# Patient Record
Sex: Male | Born: 1960 | Race: White | Hispanic: No | Marital: Single | Smoking: Never smoker
Health system: Southern US, Community
[De-identification: ages and names within clinical notes are randomized; demographics above are authoritative.]

## PROBLEM LIST (undated history)

## (undated) DIAGNOSIS — I10 Essential (primary) hypertension: Secondary | ICD-10-CM

## (undated) DIAGNOSIS — Z1211 Encounter for screening for malignant neoplasm of colon: Secondary | ICD-10-CM

## (undated) DIAGNOSIS — Z789 Other specified health status: Secondary | ICD-10-CM

## (undated) DIAGNOSIS — N529 Male erectile dysfunction, unspecified: Secondary | ICD-10-CM

## (undated) DIAGNOSIS — E785 Hyperlipidemia, unspecified: Secondary | ICD-10-CM

## (undated) HISTORY — DX: Hyperlipidemia, unspecified: E78.5

## (undated) HISTORY — PX: GANGLION CYST EXCISION: SHX1691

## (undated) HISTORY — DX: Essential (primary) hypertension: I10

## (undated) HISTORY — DX: Male erectile dysfunction, unspecified: N52.9

## (undated) HISTORY — DX: Encounter for screening for malignant neoplasm of colon: Z12.11

## (undated) HISTORY — PX: CHOLECYSTECTOMY: SHX55

---

## 2009-05-22 ENCOUNTER — Emergency Department: Payer: Self-pay | Admitting: Emergency Medicine

## 2013-03-05 ENCOUNTER — Ambulatory Visit: Payer: Self-pay | Admitting: Emergency Medicine

## 2013-10-18 ENCOUNTER — Ambulatory Visit: Payer: Self-pay | Admitting: Physician Assistant

## 2013-10-19 ENCOUNTER — Ambulatory Visit: Payer: Self-pay | Admitting: Physician Assistant

## 2013-11-18 ENCOUNTER — Ambulatory Visit: Payer: Self-pay | Admitting: Surgery

## 2013-12-16 ENCOUNTER — Ambulatory Visit: Payer: Self-pay | Admitting: Surgery

## 2013-12-30 ENCOUNTER — Ambulatory Visit: Payer: Self-pay | Admitting: Surgery

## 2014-12-24 LAB — POCT INR: INR: 1.03 (ref 0.80–1.20)

## 2014-12-24 LAB — PROTIME-INR: Protime: 11.7 (ref 10.0–13.8)

## 2015-04-21 ENCOUNTER — Ambulatory Visit
Admission: EM | Admit: 2015-04-21 | Discharge: 2015-04-21 | Disposition: A | Discharge status: Home / Self Care | Payer: BLUE CROSS/BLUE SHIELD | Attending: Emergency Medicine | Admitting: Emergency Medicine

## 2015-04-21 DIAGNOSIS — J0110 Acute frontal sinusitis, unspecified: Secondary | ICD-10-CM

## 2015-04-21 DIAGNOSIS — J069 Acute upper respiratory infection, unspecified: Secondary | ICD-10-CM

## 2015-04-21 MED ORDER — PSEUDOEPHEDRINE-GUAIFENESIN ER 120-1200 MG PO TB12: 1.0000 | ORAL_TABLET | Freq: Two times a day (BID) | ORAL | Status: DC | PRN

## 2015-04-21 MED ORDER — ALBUTEROL SULFATE HFA 108 (90 BASE) MCG/ACT IN AERS: 2.0000 | INHALATION_SPRAY | Freq: Four times a day (QID) | RESPIRATORY_TRACT | Status: DC | PRN

## 2015-04-21 MED ORDER — MOMETASONE FUROATE 50 MCG/ACT NA SUSP: 2.0000 | Freq: Every day | NASAL | Status: DC

## 2015-04-21 MED ORDER — HYDROCOD POLST-CPM POLST ER 10-8 MG/5ML PO SUER: 5.0000 mL | Freq: Two times a day (BID) | ORAL | Status: DC

## 2015-04-21 NOTE — ED Notes (Signed)
X 4 days. C/O productive cough with yellow to dark sputum, nasal congestion, cold chills, malaise, sore throat. Denies body aches, fever.

## 2015-04-21 NOTE — Discharge Instructions (Signed)
Take the medication as written. Take 1 gram of tylenol with the 800 mg motrin up to 3 times a day as needed for pain and fever. This is an effective combination. Drink extra fluids. Start taking the mucinex to keep the mucus secretions thin. Use a neti pot or the NeilMed sinus rinse as often as you want to to reduce nasal congestion. Follow the directions on the box. Return if you get worse, have a persistent fever >100.4, or for any concerns.   Go to www.goodrx.com to look up your medications. This will give you a list of where you can find your prescriptions at the most affordable prices.

## 2015-04-21 NOTE — ED Provider Notes (Signed)
  HPI  SUBJECTIVE:  Roberto Holder is a 54 y.o. male who presents with 4 days of nasal congestion, postnasal drip, malaise, body aches, feeling clammy, chest tightness, wheezing at night, coughing greenish yellowish darkish sputum at night and in the morning. Symptoms are better with over-the-counter cold medications no aggravating factors. He tried DayQuil and Benadryl. No nausea, vomiting, fevers, chest pain, short of breath, sore throat, ear pain, allergy type symptoms. No antipyretic in the past 4-6 hours. He has sick contacts with similar symptoms at work. He has not yet gotten his flu shot. Past medical history negative for asthma, emphysema, COPD, diabetes, hypertension, immunocompromise.   No past medical history on file.  No past surgical history on file.  No family history on file.  Social History  Substance Use Topics  . Smoking status: Never Smoker   . Smokeless tobacco: Never Used  . Alcohol Use: Yes    No current facility-administered medications for this encounter.  Current outpatient prescriptions:  .  albuterol (PROVENTIL HFA;VENTOLIN HFA) 108 (90 BASE) MCG/ACT inhaler, Inhale 2 puffs into the lungs every 6 (six) hours as needed for wheezing or shortness of breath., Disp: 1 Inhaler, Rfl: 0 .  chlorpheniramine-HYDROcodone (TUSSIONEX PENNKINETIC ER) 10-8 MG/5ML SUER, Take 5 mLs by mouth 2 (two) times daily., Disp: 120 mL, Rfl: 0 .  mometasone (NASONEX) 50 MCG/ACT nasal spray, Place 2 sprays into the nose daily., Disp: 17 g, Rfl: 0 .  Pseudoephedrine-Guaifenesin (MUCINEX D) [phone] MG TB12, Take 1 tablet by mouth 2 (two) times daily as needed (congestion)., Disp: 20 each, Rfl: 0  No Known Allergies   ROS  As noted in HPI.   Physical Exam  BP 122/88 mmHg  Pulse 90  Temp(Src) 98 F (36.7 C) (Oral)  Resp 16  Ht 5' 8.5" (1.74 m)  Wt 200 lb (90.719 kg)  BMI 29.96 kg/m2  SpO2 99%  Constitutional: Well developed, well nourished, no acute distress Eyes: PERRL,  EOMI, conjunctiva normal bilaterally HENT: Normocephalic, atraumatic,mucus membranes moist. Positive nasal congestion, positive frontal sinus tenderness. No purulent drainage. Normal oropharynx. TMs normal bilaterally Respiratory: Clear to auscultation bilaterally, no rales, no wheezing, no rhonchi Cardiovascular: Normal rate and rhythm, no murmurs, no gallops, no rubs GI: nondistended Lymph: No cervical lymphadenopathy. skin: No rash, skin intact Musculoskeletal: No edema, no tenderness, no deformities Neurologic: Alert & oriented x 3, CN II-XII grossly intact, no motor deficits, sensation grossly intact Psychiatric: Speech and behavior appropriate   ED Course   Medications - No data to display  No orders of the defined types were placed in this encounter.   No results found for this or any previous visit (from the past 24 hour(s)). No results found.  ED Clinical Impression  URI (upper respiratory infection)  Acute frontal sinusitis, recurrence not specified   ED Assessment/Plan No indications for antibiotics at this time. No evidence of bacterial sinusitis, otitis, pneumonia.  Home with Mucinex D, saline nasal irrigation, nasal steroids, albuterol, Cheratussin as needed for cough. Follow up with primary care physician or Return here for double sickening or not getting better in 10 days. Patient agrees with plan.  *This clinic note was created using Dragon dictation software. Therefore, there may be occasional mistakes despite careful proofreading.  ?  Melynda Ripple, MD 04/21/15 [facility]

## 2015-07-14 ENCOUNTER — Emergency Department (HOSPITAL_COMMUNITY): Payer: BLUE CROSS/BLUE SHIELD

## 2015-07-14 ENCOUNTER — Emergency Department (HOSPITAL_COMMUNITY)
Admission: EM | Admit: 2015-07-14 | Discharge: 2015-07-14 | Disposition: A | Discharge status: Home / Self Care | Payer: BLUE CROSS/BLUE SHIELD | Attending: Emergency Medicine | Admitting: Emergency Medicine

## 2015-07-14 ENCOUNTER — Encounter (HOSPITAL_COMMUNITY): Payer: Self-pay

## 2015-07-14 DIAGNOSIS — G44209 Tension-type headache, unspecified, not intractable: Secondary | ICD-10-CM | POA: Diagnosis not present

## 2015-07-14 DIAGNOSIS — Z79899 Other long term (current) drug therapy: Secondary | ICD-10-CM | POA: Diagnosis not present

## 2015-07-14 DIAGNOSIS — R002 Palpitations: Secondary | ICD-10-CM | POA: Insufficient documentation

## 2015-07-14 LAB — CBC
HEMATOCRIT: 45.4 % (ref 39.0–52.0)
Hemoglobin: 15.3 g/dL (ref 13.0–17.0)
MCH: 28.9 pg (ref 26.0–34.0)
MCHC: 33.7 g/dL (ref 30.0–36.0)
MCV: 85.8 fL (ref 78.0–100.0)
PLATELETS: 197 10*3/uL (ref 150–400)
RBC: 5.29 MIL/uL (ref 4.22–5.81)
RDW: 12.5 % (ref 11.5–15.5)
WBC: 6.5 10*3/uL (ref 4.0–10.5)

## 2015-07-14 LAB — BASIC METABOLIC PANEL
Anion gap: 10 (ref 5–15)
BUN: 12 mg/dL (ref 6–20)
CO2: 25 mmol/L (ref 22–32)
CREATININE: 1.1 mg/dL (ref 0.61–1.24)
Calcium: 9.5 mg/dL (ref 8.9–10.3)
Chloride: 106 mmol/L (ref 101–111)
GFR calc Af Amer: >60 mL/min (ref >60)
GLUCOSE: 93 mg/dL (ref 65–99)
POTASSIUM: 4.3 mmol/L (ref 3.5–5.1)
SODIUM: 141 mmol/L (ref 135–145)

## 2015-07-14 LAB — I-STAT TROPONIN, ED: Troponin i, poc: 0 ng/mL (ref 0.00–0.08)

## 2015-07-14 MED ORDER — METHOCARBAMOL 500 MG PO TABS: 1000.0000 mg | ORAL_TABLET | Freq: Three times a day (TID) | ORAL | Status: DC | PRN

## 2015-07-14 MED ORDER — KETOROLAC TROMETHAMINE 60 MG/2ML IM SOLN
60.0000 mg | Freq: Once | INTRAMUSCULAR | Status: AC
  Administered 2015-07-14: 60 mg via INTRAMUSCULAR
  Filled 2015-07-14: qty 2

## 2015-07-14 MED ORDER — METHOCARBAMOL 500 MG PO TABS
1000.0000 mg | ORAL_TABLET | Freq: Once | ORAL | Status: AC
  Administered 2015-07-14: 1000 mg via ORAL
  Filled 2015-07-14: qty 2

## 2015-07-14 MED ORDER — IBUPROFEN 600 MG PO TABS: 600.0000 mg | ORAL_TABLET | Freq: Four times a day (QID) | ORAL | Status: DC | PRN

## 2015-07-14 NOTE — Discharge Instructions (Signed)
Palpitations A palpitation is the feeling that your heartbeat is irregular or is faster than normal. It may feel like your heart is fluttering or skipping a beat. Palpitations are usually not a serious problem. However, in some cases, you may need further medical evaluation. CAUSES  Palpitations can be caused by:  Smoking.  Caffeine or other stimulants, such as diet pills or energy drinks.  Alcohol.  Stress and anxiety.  Strenuous physical activity.  Fatigue.  Certain medicines.  Heart disease, especially if you have a history of irregular heart rhythms (arrhythmias), such as atrial fibrillation, atrial flutter, or supraventricular tachycardia.  An improperly working pacemaker or defibrillator. DIAGNOSIS  To find the cause of your palpitations, your health care provider will take your medical history and perform a physical exam. Your health care provider may also have you take a test called an ambulatory electrocardiogram (ECG). An ECG records your heartbeat patterns over a 24-hour period. You may also have other tests, such as:  Transthoracic echocardiogram (TTE). During echocardiography, sound waves are used to evaluate how blood flows through your heart.  Transesophageal echocardiogram (TEE).  Cardiac monitoring. This allows your health care provider to monitor your heart rate and rhythm in real time.  Holter monitor. This is a portable device that records your heartbeat and can help diagnose heart arrhythmias. It allows your health care provider to track your heart activity for several days, if needed.  Stress tests by exercise or by giving medicine that makes the heart beat faster. TREATMENT  Treatment of palpitations depends on the cause of your symptoms and can vary greatly. Most cases of palpitations do not require any treatment other than time, relaxation, and monitoring your symptoms. Other causes, such as atrial fibrillation, atrial flutter, or supraventricular  tachycardia, usually require further treatment. HOME CARE INSTRUCTIONS   Avoid:  Caffeinated coffee, tea, soft drinks, diet pills, and energy drinks.  Chocolate.  Alcohol.  Stop smoking if you smoke.  Reduce your stress and anxiety. Things that can help you relax include:  A method of controlling things in your body, such as your heartbeats, with your mind (biofeedback).  Yoga.  Meditation.  Physical activity such as swimming, jogging, or walking.  Get plenty of rest and sleep. SEEK MEDICAL CARE IF:   You continue to have a fast or irregular heartbeat beyond 24 hours.  Your palpitations occur more often. SEEK IMMEDIATE MEDICAL CARE IF:  You have chest pain or shortness of breath.  You have a severe headache.  You feel dizzy or you faint. MAKE SURE YOU:  Understand these instructions.  Will watch your condition.  Will get help right away if you are not doing well or get worse.   This information is not intended to replace advice given to you by your health care provider. Make sure you discuss any questions you have with your health care provider.   Document Released: 06/14/2000 Document Revised: 06/22/2013 Document Reviewed: 08/16/2011 Elsevier Interactive Patient Education 2016 Elsevier Inc.  Tension Headache A tension headache is a feeling of pain, pressure, or aching that is often felt over the front and sides of the head. The pain can be dull, or it can feel tight (constricting). Tension headaches are not normally associated with nausea or vomiting, and they do not get worse with physical activity. Tension headaches can last from 30 minutes to several days. This is the most common type of headache. CAUSES The exact cause of this condition is not known. Tension headaches often begin after  stress, anxiety, or depression. Other triggers may include:  Alcohol.  Too much caffeine, or caffeine withdrawal.  Respiratory infections, such as colds, flu, or sinus  infections.  Dental problems or teeth clenching.  Fatigue.  Holding your head and neck in the same position for a long period of time, such as while using a computer.  Smoking. SYMPTOMS Symptoms of this condition include:  A feeling of pressure around the head.  Dull, aching head pain.  Pain felt over the front and sides of the head.  Tenderness in the muscles of the head, neck, and shoulders. DIAGNOSIS This condition may be diagnosed based on your symptoms and a physical exam. Tests may be done, such as a CT scan or an MRI of your head. These tests may be done if your symptoms are severe or unusual. TREATMENT This condition may be treated with lifestyle changes and medicines to help relieve symptoms. HOME CARE INSTRUCTIONS Managing Pain  Take over-the-counter and prescription medicines only as told by your health care provider.  Lie down in a dark, quiet room when you have a headache.  If directed, apply ice to the head and neck area:  Put ice in a plastic bag.  Place a towel between your skin and the bag.  Leave the ice on for 20 minutes, 2-3 times per day.  Use a heating pad or a hot shower to apply heat to the head and neck area as told by your health care provider. Eating and Drinking  Eat meals on a regular schedule.  Limit alcohol use.  Decrease your caffeine intake, or stop using caffeine. General Instructions  Keep all follow-up visits as told by your health care provider. This is important.  Keep a headache journal to help find out what may trigger your headaches. For example, write down:  What you eat and drink.  How much sleep you get.  Any change to your diet or medicines.  Try massage or other relaxation techniques.  Limit stress.  Sit up straight, and avoid tensing your muscles.  Do not use tobacco products, including cigarettes, chewing tobacco, or e-cigarettes. If you need help quitting, ask your health care provider.  Exercise  regularly as told by your health care provider.  Get 7-9 hours of sleep, or the amount recommended by your health care provider. SEEK MEDICAL CARE IF:  Your symptoms are not helped by medicine.  You have a headache that is different from what you normally experience.  You have nausea or you vomit.  You have a fever. SEEK IMMEDIATE MEDICAL CARE IF:  Your headache becomes severe.  You have repeated vomiting.  You have a stiff neck.  You have a loss of vision.  You have problems with speech.  You have pain in your eye or ear.  You have muscular weakness or loss of muscle control.  You lose your balance or you have trouble walking.  You feel faint or you pass out.  You have confusion.   This information is not intended to replace advice given to you by your health care provider. Make sure you discuss any questions you have with your health care provider.   Document Released: 06/17/2005 Document Revised: 03/08/2015 Document Reviewed: 10/10/2014 Elsevier Interactive Patient Education [employer].

## 2015-07-14 NOTE — ED Notes (Signed)
Pt is in stable condition upon d/c and ambulates from ED. 

## 2015-07-14 NOTE — ED Notes (Signed)
Patient transported to X-ray 

## 2015-07-14 NOTE — ED Notes (Signed)
Patient here with complaint of heart racing that awoke him at 0600 this morning, reports dizziness and head pressure with same

## 2015-07-14 NOTE — ED Provider Notes (Signed)
CSN: [id number]     Arrival date & time 07/14/15  0856 History   First MD Initiated Contact with Patient 07/14/15 0915     Chief Complaint  Patient presents with  . Chest Pain     (Consider location/radiation/quality/duration/timing/severity/associated sxs/prior Treatment) HPI Patient has a history of tachydysrhythmia. Possibly SVT versus atrial fibrillation. He is on no anticoagulants and denies any recent stimulant use. Woke this morning at 0600 with palpitations. Described as regular. He then developed right-sided headache described as pressure. Denied any chest pain or shortness of breath at any point. Denied visual changes, focal weakness or numbness. No neck pain or stiffness. History reviewed. No pertinent past medical history. History reviewed. No pertinent past surgical history. No family history on file. Social History  Substance Use Topics  . Smoking status: Never Smoker   . Smokeless tobacco: Never Used  . Alcohol Use: Yes    Review of Systems  Constitutional: Negative for fever and chills.  Eyes: Negative for photophobia and visual disturbance.  Respiratory: Negative for shortness of breath.   Cardiovascular: Positive for palpitations. Negative for chest pain and leg swelling.  Gastrointestinal: Negative for nausea, vomiting and abdominal pain.  Musculoskeletal: Negative for back pain, neck pain and neck stiffness.  Skin: Negative for rash and wound.  Neurological: Negative for dizziness, syncope, weakness, light-headedness, numbness and headaches.  All other systems reviewed and are negative.     Allergies  Tape  Home Medications   Prior to Admission medications   Medication Sig Start Date End Date Taking? Authorizing Provider  albuterol (PROVENTIL HFA;VENTOLIN HFA) 108 (90 BASE) MCG/ACT inhaler Inhale 2 puffs into the lungs every 6 (six) hours as needed for wheezing or shortness of breath. 04/21/15   Melynda Ripple, MD  chlorpheniramine-HYDROcodone  [facility] ER) 10-8 MG/5ML SUER Take 5 mLs by mouth 2 (two) times daily. Patient not taking: Reported on 07/14/2015 04/21/15   Melynda Ripple, MD  ibuprofen (ADVIL,MOTRIN) 600 MG tablet Take 1 tablet (600 mg total) by mouth every 6 (six) hours as needed. 07/14/15   Julianne Rice, MD  methocarbamol (ROBAXIN) 500 MG tablet Take 2 tablets (1,000 mg total) by mouth every 8 (eight) hours as needed for muscle spasms. 07/14/15   Julianne Rice, MD  mometasone (NASONEX) 50 MCG/ACT nasal spray Place 2 sprays into the nose daily. Patient not taking: Reported on 07/14/2015 04/21/15   Melynda Ripple, MD  Pseudoephedrine-Guaifenesin [facility] D) [phone] MG TB12 Take 1 tablet by mouth 2 (two) times daily as needed (congestion). Patient not taking: Reported on 07/14/2015 04/21/15   Melynda Ripple, MD   BP 119/86 mmHg  Pulse 57  Temp(Src) 97.8 F (36.6 C) (Oral)  Resp 17  SpO2 96% Physical Exam  Constitutional: He is oriented to person, place, and time. He appears well-developed and well-nourished. No distress.  HENT:  Head: Normocephalic and atraumatic.  Mouth/Throat: Oropharynx is clear and moist.  Tenderness to palpation over the right temporal region. No evidence of trauma.  Eyes: EOM are normal. Pupils are equal, round, and reactive to light.  Neck: Normal range of motion. Neck supple.  No meningismus  Cardiovascular: Normal rate and regular rhythm.  Exam reveals no gallop and no friction rub.   No murmur heard. Pulmonary/Chest: Effort normal and breath sounds normal. No respiratory distress. He has no wheezes. He has no rales. He exhibits no tenderness.  Abdominal: Soft. Bowel sounds are normal. He exhibits no distension and no mass. There is no tenderness. There is no rebound and  no guarding.  Musculoskeletal: Normal range of motion. He exhibits no edema or tenderness.  No lower extremity swelling or pain.  Neurological: He is alert and oriented to person, place, and time.   Moving all extremities without deficit. Sensation is fully intact.  Skin: Skin is warm and dry. No rash noted. No erythema.  Psychiatric: He has a normal mood and affect. His behavior is normal.  Nursing note and vitals reviewed.   ED Course  Procedures (including critical care time) Labs Review Labs Reviewed  BASIC METABOLIC PANEL  CBC  I-STAT [place], ED    Imaging Review Dg Chest 2 View  07/14/2015  CLINICAL DATA:  Mid chest pain and heart racing beginning at 6 a.m. today. Initial encounter. EXAM: CHEST  2 VIEW COMPARISON:  Single view of the chest 05/23/2009. FINDINGS: The lungs are clear. Heart size is normal. No pneumothorax or pleural effusion. No focal bony abnormality. IMPRESSION: Negative chest. Electronically Signed   By: Inge Rise M.D.   On: 07/14/2015 09:30   I have personally reviewed and evaluated these images and lab results as part of my medical decision-making.   EKG Interpretation   Date/Time:  Friday July 14 2015 08:57:18 EST Ventricular Rate:  75 PR Interval:  166 QRS Duration: 84 QT Interval:  378 QTC Calculation: 422 R Axis:   65 Text Interpretation:  Normal sinus rhythm with sinus arrhythmia Normal ECG  Confirmed by Lita Mains  MD, Moria Brophy ([id number]) on 07/14/2015 9:10:25 AM      MDM   Final diagnoses:  Rapid palpitations  Tension-type headache, not intractable, unspecified chronicity pattern    Patient with improved headache. No palpitations or chest pain. Advised to follow-up with cardiology.    Julianne Rice, MD 07/14/15 [phone]

## 2015-09-14 ENCOUNTER — Ambulatory Visit
Admission: EM | Admit: 2015-09-14 | Discharge: 2015-09-14 | Disposition: A | Discharge status: Home / Self Care | Payer: BLUE CROSS/BLUE SHIELD | Attending: Family Medicine | Admitting: Family Medicine

## 2015-09-14 ENCOUNTER — Encounter: Payer: Self-pay | Admitting: *Deleted

## 2015-09-14 DIAGNOSIS — J111 Influenza due to unidentified influenza virus with other respiratory manifestations: Secondary | ICD-10-CM

## 2015-09-14 LAB — RAPID INFLUENZA A&B ANTIGENS (ARMC ONLY): INFLUENZA B (ARMC): NEGATIVE

## 2015-09-14 LAB — RAPID INFLUENZA A&B ANTIGENS: Influenza A (ARMC): NEGATIVE

## 2015-09-14 LAB — RAPID STREP SCREEN (MED CTR MEBANE ONLY): Streptococcus, Group A Screen (Direct): NEGATIVE

## 2015-09-14 MED ORDER — OSELTAMIVIR PHOSPHATE 75 MG PO CAPS: 75.0000 mg | ORAL_CAPSULE | Freq: Two times a day (BID) | ORAL | Status: DC

## 2015-09-14 NOTE — Discharge Instructions (Signed)
Influenza, Adult °Influenza ("the flu") is a viral infection of the respiratory tract. It occurs more often in winter months because people spend more time in close contact with one another. Influenza can make you feel very sick. Influenza easily spreads from person to person (contagious). °CAUSES  °Influenza is caused by a virus that infects the respiratory tract. You can catch the virus by breathing in droplets from an infected person's cough or sneeze. You can also catch the virus by touching something that was recently contaminated with the virus and then touching your mouth, nose, or eyes. °RISKS AND COMPLICATIONS °You may be at risk for a more severe case of influenza if you smoke cigarettes, have diabetes, have chronic heart disease (such as heart failure) or lung disease (such as asthma), or if you have a weakened immune system. Elderly people and pregnant women are also at risk for more serious infections. The most common problem of influenza is a lung infection (pneumonia). Sometimes, this problem can require emergency medical care and may be life threatening. °SIGNS AND SYMPTOMS  °Symptoms typically last 4 to 10 days and may include: °· Fever. °· Chills. °· Headache, body aches, and muscle aches. °· Sore throat. °· Chest discomfort and cough. °· Poor appetite. °· Weakness or feeling tired. °· Dizziness. °· Nausea or vomiting. °DIAGNOSIS  °Diagnosis of influenza is often made based on your history and a physical exam. A nose or throat swab test can be done to confirm the diagnosis. °TREATMENT  °In mild cases, influenza goes away on its own. Treatment is directed at relieving symptoms. For more severe cases, your health care provider may prescribe antiviral medicines to shorten the sickness. Antibiotic medicines are not effective because the infection is caused by a virus, not by bacteria. °HOME CARE INSTRUCTIONS °· Take medicines only as directed by your health care provider. °· Use a cool mist humidifier  to make breathing easier. °· Get plenty of rest until your temperature returns to normal. This usually takes 3 to 4 days. °· Drink enough fluid to keep your urine clear or pale yellow. °· Cover your mouth and nose when coughing or sneezing, and wash your hands well to prevent the virus from spreading. °· Stay home from work or school until the fever is gone for at least 1 full day. °PREVENTION  °An annual influenza vaccination (flu shot) is the best way to avoid getting influenza. An annual flu shot is now routinely recommended for all adults in the [place] °SEEK MEDICAL CARE IF: °· You experience chest pain, your cough worsens, or you produce more mucus. °· You have nausea, vomiting, or diarrhea. °· Your fever returns or gets worse. °SEEK IMMEDIATE MEDICAL CARE IF: °· You have trouble breathing, you become short of breath, or your skin or nails become bluish. °· You have severe pain or stiffness in the neck. °· You develop a sudden headache, or pain in the face or ear. °· You have nausea or vomiting that you cannot control. °MAKE SURE YOU:  °· Understand these instructions. °· Will watch your condition. °· Will get help right away if you are not doing well or get worse. °  °This information is not intended to replace advice given to you by your health care provider. Make sure you discuss any questions you have with your health care provider. °  °Document Released: 06/14/2000 Document Revised: 07/08/2014 Document Reviewed: 09/16/2011 °Elsevier Interactive Patient Education ©2016 Elsevier Inc. ° °Cool Mist Vaporizers °Vaporizers may help relieve the symptoms of a cough and cold. They add moisture to the air, which helps mucus to become thinner and less sticky. This makes it easier to   breathe and cough up secretions. Cool mist vaporizers do not cause serious burns like hot mist vaporizers, which may also be called steamers or humidifiers. Vaporizers have not been proven to help with colds. You should not use a vaporizer  if you are allergic to mold. °HOME CARE INSTRUCTIONS °· Follow the package instructions for the vaporizer. °· Do not use anything other than distilled water in the vaporizer. °· Do not run the vaporizer all of the time. This can cause mold or bacteria to grow in the vaporizer. °· Clean the vaporizer after each time it is used. °· Clean and dry the vaporizer well before storing it. °· Stop using the vaporizer if worsening respiratory symptoms develop. °  °This information is not intended to replace advice given to you by your health care provider. Make sure you discuss any questions you have with your health care provider. °  °Document Released: 03/14/2004 Document Revised: 06/22/2013 Document Reviewed: 11/04/2012 °Elsevier Interactive Patient Education ©2016 Elsevier Inc. ° °

## 2015-09-14 NOTE — ED Notes (Signed)
Productive cough- brown, sore throat, runny nose, chills, body aches x4 days.

## 2015-09-14 NOTE — ED Provider Notes (Signed)
CSN: [id number]     Arrival date & time 09/14/15  1751 History   First MD Initiated Contact with Patient 09/14/15 1912     Chief Complaint  Patient presents with  . Cough  . Sore Throat  . Nasal Congestion  . Chills  . Generalized Body Aches   (Consider location/radiation/quality/duration/timing/severity/associated sxs/prior Treatment) HPI  55 year old male with a productive cough with thick brown mucus in the morning particularly runny nose chills body aches that he's had for 4 days in his worsen. Did not have his flu shot this year. 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   History reviewed. No pertinent past medical history. Past Surgical History  Procedure Laterality Date  . Cholecystectomy     History reviewed. No pertinent family history. Social History  Substance Use Topics  . Smoking status: Never Smoker   . Smokeless tobacco: Never Used  . Alcohol Use: Yes    Review of Systems  Constitutional: Positive for fever, chills, activity change and fatigue.  HENT: Positive for congestion, postnasal drip, rhinorrhea, sinus pressure, sneezing and sore throat.   Respiratory: Positive for cough. Negative for shortness of breath.   All other systems reviewed and are negative.   Allergies  Tape  Home Medications   Prior to Admission medications   Medication Sig Start Date End Date Taking? Authorizing Provider  ibuprofen (ADVIL,MOTRIN) 600 MG tablet Take 1 tablet (600 mg total) by mouth every 6 (six) hours as needed. 07/14/15  Yes Julianne Rice, MD  albuterol (PROVENTIL HFA;VENTOLIN HFA) 108 (90 BASE) MCG/ACT inhaler Inhale 2 puffs into the lungs every 6 (six) hours as needed for wheezing or shortness of breath. 04/21/15   Melynda Ripple, MD  chlorpheniramine-HYDROcodone [facility] ER) 10-8 MG/5ML SUER Take 5 mLs by mouth 2 (two) times daily. Patient not taking: Reported on 07/14/2015 04/21/15   Melynda Ripple, MD  methocarbamol (ROBAXIN) 500 MG  tablet Take 2 tablets (1,000 mg total) by mouth every 8 (eight) hours as needed for muscle spasms. 07/14/15   Julianne Rice, MD  mometasone (NASONEX) 50 MCG/ACT nasal spray Place 2 sprays into the nose daily. Patient not taking: Reported on 07/14/2015 04/21/15   Melynda Ripple, MD  oseltamivir (TAMIFLU) 75 MG capsule Take 1 capsule (75 mg total) by mouth every 12 (twelve) hours. 09/14/15   Lorin Picket, PA-C  Pseudoephedrine-Guaifenesin (MUCINEX D) [phone] MG TB12 Take 1 tablet by mouth 2 (two) times daily as needed (congestion). Patient not taking: Reported on 07/14/2015 04/21/15   Melynda Ripple, MD   Meds Ordered and Administered this Visit  Medications - No data to display  Pulse 79  Temp(Src) 97.9 F (36.6 C) (Oral)  Resp 16  Ht 5\' 9"  (1.753 m)  Wt 185 lb (83.915 kg)  BMI 27.31 kg/m2  SpO2 99% No data found.   Physical Exam  Constitutional: He is oriented to person, place, and time. He appears well-developed and well-nourished. No distress.  HENT:  Head: Normocephalic and atraumatic.  Right Ear: External ear normal.  Left Ear: External ear normal.  Nose: Nose normal.  Mouth/Throat: Oropharynx is clear and moist.  Eyes: Conjunctivae are normal. Pupils are equal, round, and reactive to light. Right eye exhibits no discharge. Left eye exhibits no discharge.  Neck: Normal range of motion. Neck supple.  Pulmonary/Chest: Effort normal and breath sounds normal. He has no wheezes. He has no rales.  Musculoskeletal: Normal range of motion. He exhibits no edema or tenderness.  Neurological: He  is alert and oriented to person, place, and time.  Skin: Skin is warm and dry. He is not diaphoretic.  Psychiatric: He has a normal mood and affect. His behavior is normal. Judgment and thought content normal.  Nursing note and vitals reviewed.   ED Course  Procedures (including critical care time)  Labs Review Labs Reviewed  RAPID INFLUENZA A&B ANTIGENS ([facility] ONLY)  RAPID STREP  SCREEN (NOT AT [facility])  CULTURE, GROUP A STREP [facility])    Imaging Review No results found.   Visual Acuity Review  Right Eye Distance:   Left Eye Distance:   Bilateral Distance:    Right Eye Near:   Left Eye Near:    Bilateral Near:         MDM   1. Influenza    New Prescriptions   OSELTAMIVIR (TAMIFLU) 75 MG CAPSULE    Take 1 capsule (75 mg total) by mouth every 12 (twelve) hours.  Plan: 1. Test/x-ray results and diagnosis reviewed with patient 2. rx as per orders; risks, benefits, potential side effects reviewed with patient 3. Recommend supportive treatment with Fluids and rest I will start him on Tamiflu as he exhibits symptoms and signs of the flu. I have given work note for Friday and Saturday he is also on the may return on Monday. I've encouraged him to continue with the use of a Dawayne Patricia and should try Flonase and consider over-the-counter Zyrtec-D. 4. F/u prn if symptoms worsen or don't improve     Lorin Picket, PA-C 09/14/15 1939

## 2015-09-16 LAB — CULTURE, GROUP A STREP (THRC)

## 2016-02-27 ENCOUNTER — Emergency Department
Admission: EM | Admit: 2016-02-27 | Discharge: 2016-02-27 | Disposition: A | Discharge status: Home / Self Care | Payer: BLUE CROSS/BLUE SHIELD | Attending: Emergency Medicine | Admitting: Emergency Medicine

## 2016-02-27 ENCOUNTER — Encounter: Payer: Self-pay | Admitting: Emergency Medicine

## 2016-02-27 ENCOUNTER — Emergency Department: Payer: BLUE CROSS/BLUE SHIELD

## 2016-02-27 DIAGNOSIS — R0789 Other chest pain: Secondary | ICD-10-CM | POA: Insufficient documentation

## 2016-02-27 DIAGNOSIS — R110 Nausea: Secondary | ICD-10-CM | POA: Diagnosis not present

## 2016-02-27 DIAGNOSIS — R079 Chest pain, unspecified: Secondary | ICD-10-CM

## 2016-02-27 LAB — BASIC METABOLIC PANEL
Anion gap: 7 (ref 5–15)
BUN: 12 mg/dL (ref 6–20)
CALCIUM: 9.4 mg/dL (ref 8.9–10.3)
CO2: 25 mmol/L (ref 22–32)
CREATININE: 0.93 mg/dL (ref 0.61–1.24)
Chloride: 106 mmol/L (ref 101–111)
Glucose, Bld: 102 mg/dL — ABNORMAL HIGH (ref 65–99)
Potassium: 4.2 mmol/L (ref 3.5–5.1)
SODIUM: 138 mmol/L (ref 135–145)

## 2016-02-27 LAB — CBC
HCT: 46.2 % (ref 40.0–52.0)
Hemoglobin: 16 g/dL (ref 13.0–18.0)
MCH: 28.7 pg (ref 26.0–34.0)
MCHC: 34.6 g/dL (ref 32.0–36.0)
MCV: 82.9 fL (ref 80.0–100.0)
PLATELETS: 249 10*3/uL (ref 150–440)
RBC: 5.58 MIL/uL (ref 4.40–5.90)
RDW: 13.2 % (ref 11.5–14.5)
WBC: 7.2 10*3/uL (ref 3.8–10.6)

## 2016-02-27 LAB — TROPONIN I

## 2016-02-27 NOTE — ED Triage Notes (Signed)
Pt c/o midsternal cp that began yesterday while watching tv, does radiate to right shoulder, and cp again this am with no activity as well. C/o nausea with the cp, also states he feels his heart "pounding really fast" with the pain as well. Appears in no distress at this time.

## 2016-02-27 NOTE — Discharge Instructions (Signed)
You have been seen in the emergency department today for chest pain. Your workup has shown normal results. As we discussed please follow-up with your primary care physician in the next 1-2 days for recheck. Return to the emergency department for any further chest pain, trouble breathing, or any other symptom personally concerning to yourself.   Please follow-up with Dr. Humphrey Rolls at 10 AM tomorrow.

## 2016-02-27 NOTE — ED Provider Notes (Signed)
[facility] Emergency Department Provider Note  Time seen: 3:48 PM  I have reviewed the triage vital signs and the nursing notes.   HISTORY  Chief Complaint Chest Pain and Nausea    HPI Roberto Holder is a 55 y.o. male with no past medical history who presents the emergency department with chest discomfort. According to the patient beginning last night his x-rays chest pressure 3 times. He states this has been ongoing for several years intermittently, but last night was the worst. States some nausea, but denies shortness of breath or diaphoresis. Denies any personal history of heart disease but states his father had a heart attack at 30 years old. Patient denies any leg pain or swelling. Denies any dyspnea with exertion. Patient denies any chest pain currently.  History reviewed. No pertinent past medical history.  There are no active problems to display for this patient.   Past Surgical History:  Procedure Laterality Date  . CHOLECYSTECTOMY      Prior to Admission medications   Not on File    Allergies  Allergen Reactions  . Tape Rash    Paper tape ok    No family history on file.  Social History Social History  Substance Use Topics  . Smoking status: Never Smoker  . Smokeless tobacco: Never Used  . Alcohol use Yes     Comment: rarely    Review of Systems Constitutional: Negative for fever. Cardiovascular: Intermittent chest pain for the past 2 days. Respiratory: Negative for shortness of breath. Gastrointestinal: Negative for abdominal pain Musculoskeletal: Negative for back pain. Neurological: Negative for headache 10-point ROS otherwise negative.  ____________________________________________   PHYSICAL EXAM:  VITAL SIGNS: ED Triage Vitals  Enc Vitals Group     BP 02/27/16 1055 140/84     Pulse Rate 02/27/16 1055 76     Resp 02/27/16 1055 20     Temp 02/27/16 1055 97.7 F (36.5 C)     Temp Source 02/27/16 1055 Oral   SpO2 02/27/16 1055 100 %     Weight 02/27/16 1056 200 lb (90.7 kg)     Height 02/27/16 1056 5' 8.5" (1.74 m)     Head Circumference --      Peak Flow --      Pain Score --      Pain Loc --      Pain Edu? --      Excl. in [place]? --     Constitutional: Alert and oriented. Well appearing and in no distress. Eyes: Normal exam ENT   Head: Normocephalic and atraumatic.   Mouth/Throat: Mucous membranes are moist. Cardiovascular: Normal rate, regular rhythm. No murmur Respiratory: Normal respiratory effort without tachypnea nor retractions. Breath sounds are clear Gastrointestinal: Soft and nontender. No distention.   Musculoskeletal: Nontender with normal range of motion in all extremities. No lower extremity tenderness or edema. Neurologic:  Normal speech and language. No gross focal neurologic deficits are appreciated. Skin:  Skin is warm, dry and intact.  Psychiatric: Mood and affect are normal. Speech and behavior are normal.   ____________________________________________    EKG  EKG reviewed and interpreted by myself shows normal sinus rhythm at 69 bpm, narrow QRS, normal axis, normal intervals, no concerning ST changes. Overall reassuring EKG.  ____________________________________________    RADIOLOGY  Chest x-ray negative  ____________________________________________   INITIAL IMPRESSION / ASSESSMENT AND PLAN / ED COURSE  Pertinent labs & imaging results that were available during my care of the patient were reviewed  by me and considered in my medical decision making (see chart for details).  Patient presents the emergency department for intermittent chest discomfort over the past 2 days. The patient states this has been ongoing for the past several years, but rarely occurs, and last night was the worst. Patient's labs including troponin are negative. Patient denies any complaints at this time. Patient appears very well with a normal physical exam. Patient's chest  x-ray and EKG are reassuring. Given the patient's age and family history and discussed the patient with Dr.Khan, and he will have the patient in his office at 10:00 tomorrow for a stress test for the patient is agreeable to this plan.  ____________________________________________   FINAL CLINICAL IMPRESSION(S) / ED DIAGNOSES  Chest pain    Harvest Dark, MD 02/27/16 1550

## 2016-02-27 NOTE — ED Notes (Signed)
EDP at bedside  

## 2017-03-20 ENCOUNTER — Encounter: Payer: Self-pay | Admitting: Emergency Medicine

## 2017-03-20 ENCOUNTER — Emergency Department
Admission: EM | Admit: 2017-03-20 | Discharge: 2017-03-20 | Disposition: A | Discharge status: Home / Self Care | Payer: Self-pay | Attending: Emergency Medicine | Admitting: Emergency Medicine

## 2017-03-20 ENCOUNTER — Emergency Department: Payer: Self-pay

## 2017-03-20 DIAGNOSIS — N50811 Right testicular pain: Secondary | ICD-10-CM

## 2017-03-20 DIAGNOSIS — R1031 Right lower quadrant pain: Secondary | ICD-10-CM | POA: Insufficient documentation

## 2017-03-20 LAB — COMPREHENSIVE METABOLIC PANEL
ALT: 20 U/L (ref 17–63)
ANION GAP: 6 (ref 5–15)
AST: 22 U/L (ref 15–41)
Albumin: 4.2 g/dL (ref 3.5–5.0)
Alkaline Phosphatase: 44 U/L (ref 38–126)
BUN: 12 mg/dL (ref 6–20)
CALCIUM: 9.1 mg/dL (ref 8.9–10.3)
CHLORIDE: 105 mmol/L (ref 101–111)
CO2: 27 mmol/L (ref 22–32)
CREATININE: 1.01 mg/dL (ref 0.61–1.24)
Glucose, Bld: 161 mg/dL — ABNORMAL HIGH (ref 65–99)
Potassium: 4.2 mmol/L (ref 3.5–5.1)
SODIUM: 138 mmol/L (ref 135–145)
Total Bilirubin: 1.1 mg/dL (ref 0.3–1.2)
Total Protein: 8.1 g/dL (ref 6.5–8.1)

## 2017-03-20 LAB — URINALYSIS, COMPLETE (UACMP) WITH MICROSCOPIC
Bacteria, UA: NONE SEEN
Bilirubin Urine: NEGATIVE
Glucose, UA: NEGATIVE mg/dL
Hgb urine dipstick: NEGATIVE
Ketones, ur: NEGATIVE mg/dL
LEUKOCYTES UA: NEGATIVE
NITRITE: NEGATIVE
PROTEIN: NEGATIVE mg/dL
RBC / HPF: NONE SEEN RBC/hpf (ref 0–5)
SPECIFIC GRAVITY, URINE: 1.02 (ref 1.005–1.030)
pH: 6 (ref 5.0–8.0)

## 2017-03-20 LAB — CBC
HCT: 44.8 % (ref 40.0–52.0)
Hemoglobin: 15.7 g/dL (ref 13.0–18.0)
MCH: 29.4 pg (ref 26.0–34.0)
MCHC: 35 g/dL (ref 32.0–36.0)
MCV: 84 fL (ref 80.0–100.0)
PLATELETS: 240 10*3/uL (ref 150–440)
RBC: 5.33 MIL/uL (ref 4.40–5.90)
RDW: 13.1 % (ref 11.5–14.5)
WBC: 6.3 10*3/uL (ref 3.8–10.6)

## 2017-03-20 MED ORDER — TRAMADOL HCL 50 MG PO TABS: 50.0000 mg | ORAL_TABLET | Freq: Four times a day (QID) | ORAL | 0 refills | Status: DC | PRN

## 2017-03-20 MED ORDER — KETOROLAC TROMETHAMINE 30 MG/ML IJ SOLN
30.0000 mg | Freq: Once | INTRAMUSCULAR | Status: AC
  Administered 2017-03-20: 30 mg via INTRAVENOUS
  Filled 2017-03-20: qty 1

## 2017-03-20 MED ORDER — IOPAMIDOL (ISOVUE-300) INJECTION 61%
100.0000 mL | Freq: Once | INTRAVENOUS | Status: AC | PRN
  Administered 2017-03-20: 100 mL via INTRAVENOUS

## 2017-03-20 NOTE — ED Triage Notes (Signed)
Pt with right side groin pain for two days with right side lower back pain. Pt unable to sit.

## 2017-03-20 NOTE — ED Provider Notes (Signed)
[facility] Emergency Department Provider Note   ____________________________________________    I have reviewed the triage vital signs and the nursing notes.   HISTORY  Chief Complaint Groin Pain     HPI Roberto Holder is a 56 y.o. male Who presents with complaints of right groin pain. Patient reports he has had pain in his right groin or the last 3 days however is gotten significantly worse over the last 24 hours. He was unable to sleep last night because the pain. He has worsening pain when he moves in particular ways. It hurts to stand up from a sitting position. He denies injury to the area. No dysuria. Never had this before. He has taken advil with minimal relief. No nausea or vomiting. No history of hernias. No heavy lifting recently. Does report he had a vasectomy many years ago.occasionally he has some soreness in the right lower back as well but this is mild   History reviewed. No pertinent past medical history.  There are no active problems to display for this patient.   Past Surgical History:  Procedure Laterality Date  . CHOLECYSTECTOMY      Prior to Admission medications   Medication Sig Start Date End Date Taking? Authorizing Provider  traMADol (ULTRAM) 50 MG tablet Take 1 tablet (50 mg total) by mouth every 6 (six) hours as needed. 03/20/17 03/20/18  Lavonia Drafts, MD     Allergies Tape  No family history on file.  Social History Social History  Substance Use Topics  . Smoking status: Never Smoker  . Smokeless tobacco: Never Used  . Alcohol use Yes     Comment: rarely    Review of Systems  Constitutional: No fever/chills Eyes: No visual changes.  ENT: No sore throat. Cardiovascular: Denies chest pain. Respiratory: Denies ough Gastrointestinal: No nausea, no vomiting.   Genitourinary: as above Musculoskeletal: Negative for back pain. Skin: Negative for rash. Neurological: Negative for  headaches   ____________________________________________   PHYSICAL EXAM:  VITAL SIGNS: ED Triage Vitals  Enc Vitals Group     BP 03/20/17 0759 (!) 146/95     Pulse Rate 03/20/17 0759 73     Resp 03/20/17 0759 20     Temp 03/20/17 0759 97.6 F (36.4 C)     Temp Source 03/20/17 0759 Oral     SpO2 03/20/17 0759 97 %     Weight 03/20/17 0800 90.7 kg (200 lb)     Height --      Head Circumference --      Peak Flow --      Pain Score 03/20/17 0800 10     Pain Loc --      Pain Edu? --      Excl. in [place]? --     Constitutional: Alert and oriented. Uncomfortable appearing, standing next to the bed. Pleasant and interactive Eyes: Conjunctivae are normal.   Nose: No congestion/rhinnorhea. Mouth/Throat: Mucous membranes are moist.    Cardiovascular: Normal rate, regular rhythm. Grossly normal heart sounds.  Good peripheral circulation. Respiratory: Normal respiratory effort.  No retractions. Lungs CTAB. Gastrointestinal: Soft and nontender. No distention.  No CVA tenderness. Genitourinary: patient with moderate to severe tenderness to palpation of the lateral border of the right testicle, no palpable hernia, no swelling, no erythema. No crepitus. No evidence of infection. No penile discharge. Musculoskeletal:  Warm and well perfused Neurologic:  Normal speech and language. No gross focal neurologic deficits are appreciated.  Skin:  Skin is  warm, dry and intact. No rash noted. Psychiatric: Mood and affect are normal. Speech and behavior are normal.  ____________________________________________   LABS (all labs ordered are listed, but only abnormal results are displayed)  Labs Reviewed  COMPREHENSIVE METABOLIC PANEL - Abnormal; Notable for the following:       Result Value   Glucose, Bld 161 (*)    All other components within normal limits  URINALYSIS, COMPLETE (UACMP) WITH MICROSCOPIC - Abnormal; Notable for the following:    Color, Urine YELLOW (*)    APPearance CLEAR (*)     Squamous Epithelial / LPF 0-5 (*)    All other components within normal limits  CBC   ____________________________________________  EKG  None ____________________________________________  RADIOLOGY  Ultrasound testicle ____________________________________________   PROCEDURES  Procedure(s) performed: No    Critical Care performed:No ____________________________________________   INITIAL IMPRESSION / ASSESSMENT AND PLAN / ED COURSE  Pertinent labs & imaging results that were available during my care of the patient were reviewed by me and considered in my medical decision making (see chart for details).  Differential diagnosis: Testicular torsion,epididymitis, hernia,kidney stone  Patient presents with significant right testicular pain, however no obvious swelling or erythema. Differential as above. Medical history is significant only for vasectomy, otherwise medical records noncontributory.  We will proceed with IV analgesics, labs and ultrasound to evaluate further  Patient's ultrasound was unremarkable. After to proceed with CT although he had significant improvement after  Toradol  CT abdomen and pelvis also unremarkable. Suspect musculoskeletal injury given reassuring workup, recommend NSAIDs, rest, ice and outpatient follow-up. Strict return precautions given   ____________________________________________   FINAL CLINICAL IMPRESSION(S) / ED DIAGNOSES  Final diagnoses:  Groin pain, right      NEW MEDICATIONS STARTED DURING THIS VISIT:  Discharge Medication List as of 03/20/2017 11:28 AM    START taking these medications   Details  traMADol (ULTRAM) 50 MG tablet Take 1 tablet (50 mg total) by mouth every 6 (six) hours as needed., Starting Thu 03/20/2017, Until Fri 03/20/2018, Print         Note:  This document was prepared using Dragon voice recognition software and may include unintentional dictation errors.    Lavonia Drafts, MD 03/20/17  [phone]

## 2017-03-20 NOTE — ED Notes (Signed)
Patient returned from CT

## 2017-03-20 NOTE — ED Notes (Signed)
Patient returned from Ultrasound. 

## 2017-07-22 ENCOUNTER — Emergency Department
Admission: EM | Admit: 2017-07-22 | Discharge: 2017-07-22 | Disposition: A | Discharge status: Home / Self Care | Payer: BLUE CROSS/BLUE SHIELD | Attending: Emergency Medicine | Admitting: Emergency Medicine

## 2017-07-22 ENCOUNTER — Emergency Department: Payer: BLUE CROSS/BLUE SHIELD

## 2017-07-22 ENCOUNTER — Encounter: Payer: Self-pay | Admitting: Emergency Medicine

## 2017-07-22 ENCOUNTER — Other Ambulatory Visit: Payer: Self-pay

## 2017-07-22 DIAGNOSIS — Y929 Unspecified place or not applicable: Secondary | ICD-10-CM | POA: Insufficient documentation

## 2017-07-22 DIAGNOSIS — Y33XXXA Other specified events, undetermined intent, initial encounter: Secondary | ICD-10-CM | POA: Diagnosis not present

## 2017-07-22 DIAGNOSIS — Y9301 Activity, walking, marching and hiking: Secondary | ICD-10-CM | POA: Insufficient documentation

## 2017-07-22 DIAGNOSIS — R202 Paresthesia of skin: Secondary | ICD-10-CM | POA: Insufficient documentation

## 2017-07-22 DIAGNOSIS — S8992XA Unspecified injury of left lower leg, initial encounter: Secondary | ICD-10-CM | POA: Insufficient documentation

## 2017-07-22 DIAGNOSIS — Y990 Civilian activity done for income or pay: Secondary | ICD-10-CM | POA: Insufficient documentation

## 2017-07-22 DIAGNOSIS — M25562 Pain in left knee: Secondary | ICD-10-CM | POA: Insufficient documentation

## 2017-07-22 MED ORDER — TRAMADOL HCL 50 MG PO TABS: 50.0000 mg | ORAL_TABLET | Freq: Four times a day (QID) | ORAL | 0 refills | Status: DC | PRN

## 2017-07-22 MED ORDER — MELOXICAM 15 MG PO TABS: 15.0000 mg | ORAL_TABLET | Freq: Every day | ORAL | 0 refills | Status: DC

## 2017-07-22 NOTE — ED Triage Notes (Signed)
Pt presents with left knee pain today. States he was sore this morning and took 2 ibuprofen and when he stepped up on a curb later, he heard and felt a pop and then his leg went out from under him. States he cannot bear weight on his left knee. Pt also states that he has tingling in his left foot; pedal pulses intact, foot is warm, cap refill <3 seconds. NAD noted.

## 2017-07-22 NOTE — ED Provider Notes (Signed)
[facility] Emergency Department Provider Note  ____________________________________________  Time seen: Approximately 4:24 PM  I have reviewed the triage vital signs and the nursing notes.   HISTORY  Chief Complaint Knee Pain    HPI Roberto Holder is a 57 y.o. male who presents the emergency department complaining of sharp left knee pain.  Patient reports that he woke up this morning, had some "aching" in the popliteal fossa region.  Patient reports that he took some ibuprofen, his knee pain settle down somewhat.  While he was at work, he took a step up onto a curb, felt a sharp pop, and his knee gave way.  Patient reports since then he has had sharp pain to the posterior knee.  He reports he is unable to bear weight on the knee due to pain.  Patient states that he has had some mild tingling to the left lower extremity distal to the injury site, but no numbness.  Patient denies any hip pain or back pain.  Patient did not fall and hit his head.  No loss of consciousness.  No medications for this complaint prior to arrival.  Patient reports that he has had no previous knee injury/trauma such as fractures, that ruptures, meniscal tears.  No other complaints at this time.  History reviewed. No pertinent past medical history.  There are no active problems to display for this patient.   Past Surgical History:  Procedure Laterality Date  . CHOLECYSTECTOMY      Prior to Admission medications   Medication Sig Start Date End Date Taking? Authorizing Provider  meloxicam (MOBIC) 15 MG tablet Take 1 tablet (15 mg total) by mouth daily. 07/22/17   Kelby Lotspeich, Charline Bills, PA-C  traMADol (ULTRAM) 50 MG tablet Take 1 tablet (50 mg total) by mouth every 6 (six) hours as needed. 03/20/17 03/20/18  Lavonia Drafts, MD  traMADol (ULTRAM) 50 MG tablet Take 1 tablet (50 mg total) by mouth every 6 (six) hours as needed. 07/22/17   Earnest Thalman, Charline Bills, PA-C     Allergies Tape  History reviewed. No pertinent family history.  Social History Social History   Tobacco Use  . Smoking status: Never Smoker  . Smokeless tobacco: Never Used  Substance Use Topics  . Alcohol use: Yes    Comment: rarely  . Drug use: No     Review of Systems  Constitutional: No fever/chills Eyes: No visual changes.  Cardiovascular: no chest pain. Respiratory: no cough. No SOB. Gastrointestinal: No abdominal pain.  No nausea, no vomiting.  Musculoskeletal: Positive for left knee injury/pain Skin: Negative for rash, abrasions, lacerations, ecchymosis. Neurological: Negative for headaches, focal weakness or numbness. 10-point ROS otherwise negative.  ____________________________________________   PHYSICAL EXAM:  VITAL SIGNS: ED Triage Vitals  Enc Vitals Group     BP 07/22/17 1518 (!) 143/98     Pulse Rate 07/22/17 1518 66     Resp 07/22/17 1518 18     Temp 07/22/17 1518 98.7 F (37.1 C)     Temp Source 07/22/17 1518 Oral     SpO2 07/22/17 1518 98 %     Weight 07/22/17 1519 185 lb (83.9 kg)     Height 07/22/17 1519 5\' 9"  (1.753 m)     Head Circumference --      Peak Flow --      Pain Score 07/22/17 1519 10     Pain Loc --      Pain Edu? --      Excl.  in [place]? --      Constitutional: Alert and oriented. Well appearing and in no acute distress. Eyes: Conjunctivae are normal. PERRL. EOMI. Head: Atraumatic. Neck: No stridor.    Cardiovascular: Normal rate, regular rhythm. Normal S1 and S2.  Good peripheral circulation. Respiratory: Normal respiratory effort without tachypnea or retractions. Lungs CTAB. Good air entry to the bases with no decreased or absent breath sounds. Musculoskeletal: Full range of motion to all extremities. No gross deformities appreciated.  No gross deformities, edema, ecchymosis noted to the left knee.  No ballottement.  Patient does have good extension and flexion of the knee joint.  Examination of the hip and ankles  unremarkable.  Patient is tender to palpation in the popliteal fossa.  Purple area consistent with Baker's cyst versus sesamoid bone is appreciated.  This area is tender.  No other palpable abnormality.  Patient is nontender to palpation over the anterior aspect of the knee.  Varus, valgus, Lachman's is negative.  Dorsalis pedis pulse intact distally.  Sensation intact and equal to unaffected extremity in all dermatomal distributions. Neurologic:  Normal speech and language. No gross focal neurologic deficits are appreciated.  Skin:  Skin is warm, dry and intact. No rash noted. Psychiatric: Mood and affect are normal. Speech and behavior are normal. Patient exhibits appropriate insight and judgement.   ____________________________________________   LABS (all labs ordered are listed, but only abnormal results are displayed)  Labs Reviewed - No data to display ____________________________________________  EKG   ____________________________________________  RADIOLOGY Diamantina [facility] Hipolito Martinezlopez, personally viewed and evaluated these images (plain radiographs) as part of my medical decision making, as well as reviewing the written report by the radiologist.  Dg Knee Complete 4 Views Left  Result Date: 07/22/2017 CLINICAL DATA:  Left knee injury.  Left knee pain. EXAM: LEFT KNEE - COMPLETE 4+ VIEW COMPARISON:  No prior. FINDINGS: Tiny knee joint effusion cannot be excluded. Mild medial compartment degenerative change. Tiny bony density noted over the femoral tibial joint space, this could represent small bone island. No other focal bony abnormality identified. IMPRESSION: 1.  Tiny knee joint effusion cannot be excluded. 2. Mild medial compartment degenerative change. Tiny loose body cannot be excluded. No prominent fracture. No dislocation. Electronically Signed   By: Marcello Moores  Register   On: 07/22/2017 15:51    ____________________________________________    PROCEDURES  Procedure(s) performed:     .Splint Application Date/Time: 0/16/0109 4:27 PM Performed by: Darletta Moll, PA-C Authorized by: Darletta Moll, PA-C   Consent:    Consent obtained:  Verbal   Consent given by:  Patient   Risks discussed:  Pain and swelling Pre-procedure details:    Sensation:  Normal Procedure details:    Laterality:  Left   Location:  Knee   Knee:  L knee   Splint type:  Knee immobilizer   Supplies:  Prefabricated splint Post-procedure details:    Pain:  Improved   Sensation:  Normal   Patient tolerance of procedure:  Tolerated well, no immediate complications      Medications - No data to display   ____________________________________________   INITIAL IMPRESSION / ASSESSMENT AND PLAN / ED COURSE  Pertinent labs & imaging results that were available during my care of the patient were reviewed by me and considered in my medical decision making (see chart for details).  Review of the [place] CSRS was performed in accordance of the [place] prior to dispensing any controlled drugs.     Patient's diagnosis is  consistent with left knee injury.  Presented to the emergency department with sharp posterior knee pain.  Initial differential included fracture versus strain versus ligament rupture versus meniscal tear.  Patient reports that pain started this morning, worsened after he was taking a step up.  On exam, patient has tenderness to palpation in the popliteal fossa with palpable abnormality consistent with either Baker's cyst or sesamoid bone.  Radiological imaging reveals no acute fractures.  Exam is reassuring with no significant laxity on stress testing.  Injury is consistent with knee strain versus previous inflammation of Baker's cyst versus sesamoid bone.  At this time, patient will have his knee immobilized, placed on crutches.  I will put the patient on anti-inflammatories and very limited pain medication.  If symptoms do not improve with immobilization, rest, and Torres,  patient is to follow-up with orthopedics for further evaluation and management.  Patient will be discharged home with prescriptions for meloxicam and limited Ultram. Patient is to follow up with orthopedics as needed or otherwise directed. Patient is given ED precautions to return to the ED for any worsening or new symptoms.     ____________________________________________  FINAL CLINICAL IMPRESSION(S) / ED DIAGNOSES  Final diagnoses:  Acute pain of left knee  Injury of left knee, initial encounter      NEW MEDICATIONS STARTED DURING THIS VISIT:  ED Discharge Orders        Ordered    meloxicam (MOBIC) 15 MG tablet  Daily     07/22/17 1630    traMADol (ULTRAM) 50 MG tablet  Every 6 hours PRN     07/22/17 1630          This chart was dictated using voice recognition software/Dragon. Despite best efforts to proofread, errors can occur which can change the meaning. Any change was purely unintentional.    Darletta Moll, PA-C 07/22/17 1631    Nance Pear, MD 07/22/17 1725

## 2017-07-22 NOTE — ED Notes (Signed)
See triage note  States his left knee gave out when stepping up on curb  States he heard a pop and is not able to bear wt

## 2018-01-09 ENCOUNTER — Other Ambulatory Visit: Payer: Self-pay | Admitting: Orthopedic Surgery

## 2018-01-09 DIAGNOSIS — M25562 Pain in left knee: Secondary | ICD-10-CM

## 2018-01-15 ENCOUNTER — Ambulatory Visit
Admission: RE | Admit: 2018-01-15 | Discharge: 2018-01-15 | Disposition: A | Discharge status: Home / Self Care | Payer: Commercial Managed Care - HMO | Source: Ambulatory Visit | Attending: Orthopedic Surgery | Admitting: Orthopedic Surgery

## 2018-01-15 DIAGNOSIS — S83242A Other tear of medial meniscus, current injury, left knee, initial encounter: Secondary | ICD-10-CM | POA: Diagnosis not present

## 2018-01-15 DIAGNOSIS — M949 Disorder of cartilage, unspecified: Secondary | ICD-10-CM | POA: Diagnosis not present

## 2018-01-15 DIAGNOSIS — M25562 Pain in left knee: Secondary | ICD-10-CM

## 2018-01-15 DIAGNOSIS — X58XXXA Exposure to other specified factors, initial encounter: Secondary | ICD-10-CM | POA: Diagnosis not present

## 2018-01-15 DIAGNOSIS — M25362 Other instability, left knee: Secondary | ICD-10-CM | POA: Diagnosis present

## 2018-01-20 ENCOUNTER — Other Ambulatory Visit: Payer: Self-pay

## 2018-01-20 ENCOUNTER — Encounter: Payer: Self-pay | Admitting: *Deleted

## 2018-01-22 ENCOUNTER — Ambulatory Visit: Payer: BLUE CROSS/BLUE SHIELD

## 2018-01-24 ENCOUNTER — Ambulatory Visit: Payer: BLUE CROSS/BLUE SHIELD

## 2018-01-27 ENCOUNTER — Ambulatory Visit: Payer: Commercial Managed Care - HMO | Admitting: Anesthesiology

## 2018-01-27 ENCOUNTER — Ambulatory Visit
Admission: RE | Admit: 2018-01-27 | Discharge: 2018-01-27 | Disposition: A | Discharge status: Home / Self Care | Payer: Commercial Managed Care - HMO | Source: Ambulatory Visit | Attending: Orthopedic Surgery | Admitting: Orthopedic Surgery

## 2018-01-27 ENCOUNTER — Encounter
Admission: RE | Disposition: A | Discharge status: Home / Self Care | Payer: Self-pay | Source: Ambulatory Visit | Attending: Orthopedic Surgery

## 2018-01-27 DIAGNOSIS — Y9301 Activity, walking, marching and hiking: Secondary | ICD-10-CM | POA: Diagnosis not present

## 2018-01-27 DIAGNOSIS — Y9289 Other specified places as the place of occurrence of the external cause: Secondary | ICD-10-CM | POA: Diagnosis not present

## 2018-01-27 DIAGNOSIS — S83242A Other tear of medial meniscus, current injury, left knee, initial encounter: Secondary | ICD-10-CM | POA: Insufficient documentation

## 2018-01-27 DIAGNOSIS — X509XXA Other and unspecified overexertion or strenuous movements or postures, initial encounter: Secondary | ICD-10-CM | POA: Diagnosis not present

## 2018-01-27 HISTORY — DX: Other specified health status: Z78.9

## 2018-01-27 HISTORY — PX: KNEE ARTHROTOMY: SHX5881

## 2018-01-27 SURGERY — ARTHROTOMY, KNEE
Anesthesia: General | Laterality: Left | Wound class: "Clean "

## 2018-01-27 MED ORDER — ACETAMINOPHEN 325 MG PO TABS: 325.0000 mg | ORAL_TABLET | ORAL | Status: DC | PRN

## 2018-01-27 MED ORDER — OXYCODONE HCL 5 MG PO TABS
5.0000 mg | ORAL_TABLET | Freq: Once | ORAL | Status: AC | PRN
  Administered 2018-01-27: 5 mg via ORAL

## 2018-01-27 MED ORDER — LIDOCAINE-EPINEPHRINE 1 %-1:100000 IJ SOLN
INTRAMUSCULAR | Status: DC | PRN
Start: 2018-01-27 — End: 2018-01-27
  Administered 2018-01-27: 5 mL

## 2018-01-27 MED ORDER — LACTATED RINGERS IV SOLN
INTRAVENOUS | Status: DC
  Administered 2018-01-27 (×2): via INTRAVENOUS

## 2018-01-27 MED ORDER — ONDANSETRON HCL 4 MG/2ML IJ SOLN
INTRAMUSCULAR | Status: DC | PRN
  Administered 2018-01-27: 4 mg via INTRAVENOUS

## 2018-01-27 MED ORDER — ROPIVACAINE HCL 5 MG/ML IJ SOLN
INTRAMUSCULAR | Status: DC | PRN
  Administered 2018-01-27: 20 mL via EPIDURAL

## 2018-01-27 MED ORDER — OXYCODONE HCL 5 MG PO TABS: 5.0000 mg | ORAL_TABLET | ORAL | 0 refills | Status: AC | PRN

## 2018-01-27 MED ORDER — GLYCOPYRROLATE 0.2 MG/ML IJ SOLN
INTRAMUSCULAR | Status: DC | PRN
  Administered 2018-01-27: 0.2 mg via INTRAVENOUS

## 2018-01-27 MED ORDER — DEXAMETHASONE SODIUM PHOSPHATE 4 MG/ML IJ SOLN
INTRAMUSCULAR | Status: DC | PRN
  Administered 2018-01-27: 4 mg via PERINEURAL
  Administered 2018-01-27: 4 mg via INTRAVENOUS

## 2018-01-27 MED ORDER — ACETAMINOPHEN 500 MG PO TABS: 1000.0000 mg | ORAL_TABLET | Freq: Three times a day (TID) | ORAL | 2 refills | Status: AC

## 2018-01-27 MED ORDER — ONDANSETRON 4 MG PO TBDP: 4.0000 mg | ORAL_TABLET | Freq: Three times a day (TID) | ORAL | 0 refills | Status: DC | PRN

## 2018-01-27 MED ORDER — BUPIVACAINE HCL 0.5 % IJ SOLN
INTRAMUSCULAR | Status: DC | PRN
  Administered 2018-01-27: 5 mL

## 2018-01-27 MED ORDER — MIDAZOLAM HCL 5 MG/5ML IJ SOLN
INTRAMUSCULAR | Status: DC | PRN
  Administered 2018-01-27: 2 mg via INTRAVENOUS

## 2018-01-27 MED ORDER — DEXMEDETOMIDINE HCL 200 MCG/2ML IV SOLN
INTRAVENOUS | Status: DC | PRN
  Administered 2018-01-27: 8 ug via INTRAVENOUS

## 2018-01-27 MED ORDER — ACETAMINOPHEN 160 MG/5ML PO SOLN: 325.0000 mg | ORAL | Status: DC | PRN

## 2018-01-27 MED ORDER — LIDOCAINE HCL (CARDIAC) PF 100 MG/5ML IV SOSY
PREFILLED_SYRINGE | INTRAVENOUS | Status: DC | PRN
  Administered 2018-01-27: 40 mg via INTRATRACHEAL

## 2018-01-27 MED ORDER — CEFAZOLIN SODIUM 1 G IJ SOLR
2000.0000 mg | Freq: Once | INTRAMUSCULAR | Status: AC
  Administered 2018-01-27: 2000 mg via INTRAVENOUS

## 2018-01-27 MED ORDER — HYDROMORPHONE HCL 1 MG/ML IJ SOLN: 0.2500 mg | INTRAMUSCULAR | Status: DC | PRN

## 2018-01-27 MED ORDER — FENTANYL CITRATE (PF) 100 MCG/2ML IJ SOLN
INTRAMUSCULAR | Status: DC | PRN
  Administered 2018-01-27 (×3): 25 ug via INTRAVENOUS
  Administered 2018-01-27: 100 ug via INTRAVENOUS
  Administered 2018-01-27: 25 ug via INTRAVENOUS

## 2018-01-27 MED ORDER — IBUPROFEN 800 MG PO TABS: 800.0000 mg | ORAL_TABLET | Freq: Three times a day (TID) | ORAL | 0 refills | Status: AC

## 2018-01-27 MED ORDER — PROPOFOL 10 MG/ML IV BOLUS
INTRAVENOUS | Status: DC | PRN
  Administered 2018-01-27: 200 mg via INTRAVENOUS

## 2018-01-27 MED ORDER — ONDANSETRON HCL 4 MG/2ML IJ SOLN: 4.0000 mg | Freq: Once | INTRAMUSCULAR | Status: DC | PRN

## 2018-01-27 MED ORDER — EPHEDRINE SULFATE 50 MG/ML IJ SOLN
INTRAMUSCULAR | Status: DC | PRN
  Administered 2018-01-27 (×2): 5 mg via INTRAVENOUS
  Administered 2018-01-27: 10 mg via INTRAVENOUS
  Administered 2018-01-27 (×3): 5 mg via INTRAVENOUS

## 2018-01-27 MED ORDER — OXYCODONE HCL 5 MG/5ML PO SOLN: 5.0000 mg | Freq: Once | ORAL | Status: AC | PRN

## 2018-01-27 MED ORDER — ASPIRIN EC 325 MG PO TBEC: 325.0000 mg | DELAYED_RELEASE_TABLET | Freq: Every day | ORAL | 0 refills | Status: AC

## 2018-01-27 SURGICAL SUPPLY — 50 items
ADAPTER IRRIG TUBE 2 SPIKE SOL (ADAPTER) ×6 IMPLANT
ANCHOR SUT BIO SW 4.75X19.1 (Anchor) ×2 IMPLANT
ANCHOR SWIVELOCK BIO COMP (Anchor) ×2 IMPLANT
BANDAGE ACE 6X5 VEL STRL LF (GAUZE/BANDAGES/DRESSINGS) ×3 IMPLANT
BLADE SURG 15 STRL LF DISP TIS (BLADE) ×1 IMPLANT
BLADE SURG 15 STRL SS (BLADE) ×2
BLADE SURG SZ11 CARB STEEL (BLADE) ×3 IMPLANT
BNDG COHESIVE 4X5 TAN STRL (GAUZE/BANDAGES/DRESSINGS) ×3 IMPLANT
BNDG ESMARK 6X12 TAN STRL LF (GAUZE/BANDAGES/DRESSINGS) ×2 IMPLANT
BUR RADIUS 3.5 (BURR) ×3 IMPLANT
CARTRIDGE SUT 2-0 NONSTITCH (Anchor) ×4 IMPLANT
CHLORAPREP W/TINT 26ML (MISCELLANEOUS) ×3 IMPLANT
COOLER POLAR GLACIER W/PUMP (MISCELLANEOUS) ×3 IMPLANT
COVER LIGHT HANDLE UNIVERSAL (MISCELLANEOUS) ×6 IMPLANT
CUFF TOURN SGL QUICK 30 (MISCELLANEOUS) ×2
CUFF TRNQT CYL LO 30X4X (MISCELLANEOUS) IMPLANT
DRAPE IMP U-DRAPE 54X76 (DRAPES) ×3 IMPLANT
GAUZE SPONGE 4X4 12PLY STRL (GAUZE/BANDAGES/DRESSINGS) ×3 IMPLANT
GLOVE BIO SURGEON STRL SZ7.5 (GLOVE) ×6 IMPLANT
GLOVE BIOGEL PI IND STRL 8 (GLOVE) ×2 IMPLANT
GLOVE BIOGEL PI INDICATOR 8 (GLOVE) ×4
GOWN STRL REUS W/ TWL LRG LVL3 (GOWN DISPOSABLE) ×1 IMPLANT
GOWN STRL REUS W/ TWL XL LVL3 (GOWN DISPOSABLE) ×1 IMPLANT
GOWN STRL REUS W/TWL LRG LVL3 (GOWN DISPOSABLE) ×2
GOWN STRL REUS W/TWL XL LVL3 (GOWN DISPOSABLE) ×2
IMPL SYS MENISCAL ROOT REPAIR (Orthopedic Implant) ×2 IMPLANT
IV LACTATED RINGER IRRG 3000ML (IV SOLUTION) ×32
IV LR IRRIG 3000ML ARTHROMATIC (IV SOLUTION) ×10 IMPLANT
MAT ABSORB  FLUID 56X50 GRAY (MISCELLANEOUS) ×4
MAT ABSORB FLUID 56X50 GRAY (MISCELLANEOUS) ×2 IMPLANT
NEPTUNE MANIFOLD (MISCELLANEOUS) ×5 IMPLANT
NOVOSTICH PRO MENISCAL 2-0 (Miscellaneous) ×6 IMPLANT
PACK ARTHROSCOPY KNEE (MISCELLANEOUS) ×3 IMPLANT
PAD WRAPON POLAR KNEE (MISCELLANEOUS) ×1 IMPLANT
PADDING CAST BLEND 6X4 STRL (MISCELLANEOUS) ×1 IMPLANT
PADDING STRL CAST 6IN (MISCELLANEOUS) ×2
SET TUBE SUCT SHAVER OUTFL 24K (TUBING) ×3 IMPLANT
SET TUBE TIP INTRA-ARTICULAR (MISCELLANEOUS) ×3 IMPLANT
SUT FIBERSNARE 2 CLSD LOOP (SUTURE) ×2 IMPLANT
SUT MNCRL 4-0 (SUTURE) ×2
SUT MNCRL 4-0 27XMFL (SUTURE) ×1
SUT VIC AB 0 CT1 36 (SUTURE) ×2 IMPLANT
SUT VIC AB 2-0 CT1 27 (SUTURE) ×2
SUT VIC AB 2-0 CT1 TAPERPNT 27 (SUTURE) IMPLANT
SUTURE MNCRL 4-0 27XMF (SUTURE) IMPLANT
SYSTEM IMPL ACL/PCL SWIVILLOCK (Anchor) ×2 IMPLANT
SYSTEM NVSTCH PRO MENISCAL 2-0 (Miscellaneous) IMPLANT
TUBING ARTHRO INFLOW-ONLY STRL (TUBING) ×3 IMPLANT
WAND HAND CNTRL MULTIVAC 90 (MISCELLANEOUS) ×2 IMPLANT
WRAPON POLAR PAD KNEE (MISCELLANEOUS) ×3

## 2018-01-27 NOTE — H&P (Signed)
Paper H&P to be scanned into permanent record. H&P reviewed. No significant changes noted.  

## 2018-01-27 NOTE — Anesthesia Procedure Notes (Signed)
Anesthesia Regional Block: Adductor canal block   Pre-Anesthetic Checklist: ,, timeout performed, Correct Patient, Correct Site, Correct Laterality, Correct Procedure, Correct Position, site marked, Risks and benefits discussed,  Surgical consent,  Pre-op evaluation,  At surgeon's request and post-op pain management   Prep: chloraprep       Needles:  Injection technique: Single-shot  Needle Type: Echogenic Needle     Needle Length: 9cm  Needle Gauge: 21     Additional Needles:   Procedures:,,,, ultrasound used (permanent image in chart),,,,  Narrative:  Injection made incrementally with aspirations every 5 mL.  Performed by: Personally   Additional Notes: Functioning IV was confirmed and monitors applied. Ultrasound guidance: relevant anatomy identified, needle position confirmed, local anesthetic spread visualized around nerve(s)., vascular puncture avoided.  Image printed for medical record.  Negative aspiration and no paresthesias; incremental administration of local anesthetic. The patient tolerated the procedure well. Vitals signes recorded in RN notes.

## 2018-01-27 NOTE — Anesthesia Preprocedure Evaluation (Signed)
Anesthesia Evaluation  Patient identified by MRN, date of birth, ID band Patient awake    Reviewed: Allergy & Precautions, H&P , NPO status , Patient's Chart, lab work & pertinent test results, reviewed documented beta blocker date and time   Airway Mallampati: I  TM Distance: >3 FB Neck ROM: full    Dental no notable dental hx.    Pulmonary neg pulmonary ROS,    Pulmonary exam normal breath sounds clear to auscultation       Cardiovascular Exercise Tolerance: Good negative cardio ROS Normal cardiovascular exam Rhythm:regular Rate:Normal     Neuro/Psych negative neurological ROS  negative psych ROS   GI/Hepatic negative GI ROS, Neg liver ROS,   Endo/Other  negative endocrine ROS  Renal/GU negative Renal ROS  negative genitourinary   Musculoskeletal   Abdominal   Peds  Hematology negative hematology ROS (+)   Anesthesia Other Findings   Reproductive/Obstetrics negative OB ROS                             Anesthesia Physical Anesthesia Plan  ASA: I  Anesthesia Plan: General LMA   Post-op Pain Management:  Regional for Post-op pain   Induction:   PONV Risk Score and Plan: Ondansetron and Dexamethasone  Airway Management Planned:   Additional Equipment:   Intra-op Plan:   Post-operative Plan:   Informed Consent: I have reviewed the patients History and Physical, chart, labs and discussed the procedure including the risks, benefits and alternatives for the proposed anesthesia with the patient or authorized representative who has indicated his/her understanding and acceptance.   Dental Advisory Given  Plan Discussed with: CRNA and Anesthesiologist  Anesthesia Plan Comments:         Anesthesia Quick Evaluation

## 2018-01-27 NOTE — Op Note (Addendum)
DATE: 01/27/2018  PRE-OP DIAGNOSIS:  1. Left medial meniscus root tear  POST-OP DIAGNOSIS:  1. Left medial meniscus root tear 2. Left knee loose bodies  PROCEDURES:  1. Left knee medial meniscus root repair  2. Left knee arthroscopic loose body removal  SURGEON:  Langston Reusing, MD  ASSISTANT(S):  None  ANESTHESIA: regional adductor canal block + general  TOTAL IV FLUIDS: See anesthesia record  ESTIMATED BLOOD LOSS: 10cc  TOURNIQUET TIME:  130 min  DRAINS:  None.  SPECIMENS: None  IMPLANTS:  - Arthrex Biocomposite 4.59mm SwiveLock (x1)   COMPLICATIONS: None  INDICATIONS: Roberto Holder is a 57 y.o. male with L knee pain that has failed non-operative management. Clinical exam and radiographic studies were notable for left knee pain and swelling with instances of giving way with associated popping and catching on the medial aspect of her knee. Additionally, MRI showed a medial meniscus root tear. Given the poor long-term prognosis of a meniscus root tear and high likelihood of significant progression of osteoarthritis, we elected to proceed with the above procedure after a discussion of the risks, benefits, and alternatives to surgery.   OPERATIVE FINDINGS:   Examination under anesthesia:A careful examination under anesthesia was performed. Passive range of motion was: Hyperextension: 2. Extension: 0. Flexion: 140. Lachman: normal. Pivot Shift: normal. Posterior drawer: normal. Varus stability in full extension: normal. Varus stability in 30 degrees of flexion: normal. Valgus stability in full extension: normal. Valgus stability in 30 degrees of flexion: normal.  Intra-operative findings:A thorough arthroscopic examination of the knee was performed. The findings are: 1. Suprapatellar pouch: Normal 2. Undersurface of median ridge: Grade 1 degenerative changes 3. Medial patellar facet: Grade 1 degenerative changes 4. Lateral patellar facet:  Grade 1 degenerative changes 5. Trochlea: Normal 6. Lateral gutter/popliteus tendon: Loose body present 7. Hoffa's fat pad: Inflamed 8. Medial gutter/plica: multiple loose bodies 9. ACL: Normal 10. PCL: Normal 11. Medial meniscus: Complete tear of the medial meniscus at the posterior root. No other tear of the medial meniscus was identified 12. Medial compartment cartilage: areas of Grade 2 degenerative changes on MFC, grade 1 changes on tibial plateau 13. Lateral meniscus: Normal 14. Lateral compartment cartilage: Normal  DESCRIPTION OF PROCEDURE: I identified Roberto Holder the pre-operative holding area. I marked theoperativeknee with my initials. I reviewed the risks and benefits of the proposed surgical intervention and the patient (and/or patient's guardian) wished to proceed. The patient was transferred to the operative suite and placed in the supine position with all bony prominences padded. Anesthesia was administered. Appropriate IV antibioticswere administered prior to incision. The extremity was then prepped and draped in standard fashion. A time out was performed confirming the correct extremity, correct patient, and correct procedure.  Arthroscopy portals were marked. Local anesthetic was injected to the planned portal sites. The anterolateral portalwasestablished with an 11blade. The arthroscope was placed in the anterolateral portal and theninto the suprapatellar pouch. A diagnostic knee scope was completed with the above findings. Next the medial portal was established under needle localization. A shaver was used to remove the multiple loose bodies in the medial gutter. A shaver was also used to remove the loose body from the lateral gutter. The MCL was pie-crusted to improve visualization of the posterior horn. The meniscus root tear was identified and probed to confirm our findings. Next, an Arthrex meniscus root aiming guide was used to mark out the tibial incision.  An approximately 5cm vertical incision was made medial to the tibial  tubercle. This was carried down to the sartorius fascia with bovie electrocautery, and the fascia was incised. An elevator was used to clear periosteum from the tibia in the area of the anticipated bone tunnel. Hemostasis was achieved. The guide was reinserted into the tibia and was placed over the anatomic footprint of the medial meniscus root by hooking the posterior tibial cortex and setting the guide to 7.23mm off the posterior cortex. We then used a 6.74mm FlipCutter to drill into the tibia and create a 73mm socket for the meniscus root. A FiberStick was passed through our tibial tunnel and into the joint. This was retrieved and passed through the anterolateral portal.   Next, we passed a Ceterix Novostitch 2-0 stitch just medial to the root of the medial meniscus in a mattress fashion. A luggage tag stitch was created and passed into the joint, which showed excellent purchase of the meniscus root. This stitch and configuration was repeated ~51mm medial to the first stitch. We ensured there was no soft tissue bridge between the sutures and pulled the passing stitch from the [place] through the anteromedial portal. We then used the passing stitch to bring the sutures in the meniscus out through the tibial tunnel. These were then passed through an [employer] anchor. Anchor hole was drilled ~3cm distal previously drilled tibial tunnel. Anchor was inserted with appropriate tension while visualizing the repair with the arthroscope, and this achieved excellent interference fit. The meniscus root was probed and found to be stable.   Any loose bony debris was removed from the knee joint with a shaver and excess fluid was evacuated from the joint. Closure of the portals with 3-0 Nylon was performed. The sartorius fascia was closed 0 vicryl and the Fiberwire from the SwiveLock anchor. The subdermal layer of the tibial incision was closed with  2-0 vicryl and the skin was closed with 4-0 Monocryl in a running fashion and Dermabond.  Xeroform gauze and dry sterile dressings were applied. A PolarCare and hinged knee brace were also applied.   Instrument, sponge, and needle counts were correct prior to wound closure and at the conclusion of the case.    DISPOSITION: PACU - hemodynamically stable.   POSTOPERATIVE PLAN: The patient will be discharged home today.    Non-weight bearing x 4 weeks. 50% WB from weeks 4-6. ASA for DVT ppx x 4 weeks, Narcotic medication, NSAID, and acetaminophen as discussed pre-operatively. The patient will be attending physical therapy beginning 3-4 days post-op. Physical therapy per Meniscus Root Repair Rehab Guidelines.  Patient to return to clinic 10-14 days postop for suture removal.

## 2018-01-27 NOTE — Anesthesia Procedure Notes (Signed)
Procedure Name: LMA Insertion Date/Time: 01/27/2018 1:47 PM Performed by: Janna Arch, CRNA Pre-anesthesia Checklist: Patient identified, Emergency Drugs available, Suction available and Patient being monitored Patient Re-evaluated:Patient Re-evaluated prior to induction Oxygen Delivery Method: Circle system utilized Preoxygenation: Pre-oxygenation with 100% oxygen Induction Type: IV induction Ventilation: Mask ventilation without difficulty LMA: LMA inserted LMA Size: 5.0 Number of attempts: 1 Tube secured with: Tape

## 2018-01-27 NOTE — Discharge Instructions (Signed)
Arthroscopic Knee Surgery - Meniscus Repair   Post-Op Instructions   1. Bracing or crutches: Crutches will be provided at the time of discharge from the surgery center. Keep brace locked in extension at all times except as directed by physical therapy.    2. Ice: You may be provided with a device [facility]) that allows you to ice the affected area effectively. Otherwise you can ice manually.    3. Driving:  Plan on not driving for at least four weeks. Please note that you are advised NOT to drive while taking narcotic pain medications as you may be impaired and unsafe to drive.   4. Activity: Ankle pumps several times an hour while awake to prevent blood clots. Weight bearing: NO WEIGHT BEARING FOR 4 WEEKS. Use crutches for at least 4 weeks, if not 6 based on your surgery. Bending and straightening the knee is unlimited, but do not flex your knee past 90 degrees until cleared by your therapist. Elevate knee above heart level as much as possible for one week. Avoid standing more than 5 minutes (consecutively) for the first week. No exercise involving the knee until cleared by the surgeon or physical therapist.  Avoid long distance travel for 4 weeks.  5. Medications:  - You have been provided a prescription for narcotic pain medicine. After surgery, take 1-2 narcotic tablets every 4 hours if needed for severe pain.  - A prescription for anti-nausea medication will be provided in case the narcotic medicine causes nausea - take 1 tablet every 6 hours only if nauseated.  - Take ibuprofen 800 mg every 8 hours with food to reduce post-operative knee swelling. DO NOT STOP IBUPROFEN POST-OP UNTIL INSTRUCTED TO DO SO at first post-op office visit (10-14 days after surgery).  - Take enteric coated aspirin 325 mg once daily for 4 weeks to prevent blood clots.  -Take tylenol 1000 every 8 hours for pain.  May stop tylenol 3 days after surgery or when you are having minimal pain.   If you are taking  prescription medication for anxiety, depression, insomnia, muscle spasm, chronic pain, or for attention deficit disorder you are advised that you are at a higher risk of adverse effects with use of narcotics post-op, including narcotic addiction/dependence, depressed breathing, death. If you use non-prescribed substances: alcohol, marijuana, cocaine, heroin, methamphetamines, etc., you are at a higher risk of adverse effects with use of narcotics post-op, including narcotic addiction/dependence, depressed breathing, death. You are advised that taking > 50 morphine milligram equivalents (MME) of narcotic pain medication per day results in twice the risk of overdose or death. For your prescription provided: oxycodone 5 mg - taking more than 6 tablets per day. Be advised that we will prescribe narcotics short-term, for acute post-operative pain only - 1 week for minor operations such as knee arthroscopy for meniscus tear resection, and 3 weeks for major operations such as knee repair/reconstruction surgeries.   6. Bandages: The physical therapist should change the bandages at the first post-op appointment. If needed, the dressing supplies have been provided to you. You may shower after this with waterproof bandaids covering the incisions.    7. Physical Therapy: 2 times per week for the first 4 weeks, then 1-2 times per week from weeks 4-8 post-op. Therapy typically starts on post operative Day 3 or 4. You have been provided an order for physical therapy. The therapist will provide home exercises.   8. Work: May return to full work when off of crutches. May  do light duty/desk job in approximately 1-2 weeks when off of narcotics, pain is well-controlled, and swelling has decreased.   9. Post-Op Appointments: Your first post-op appointment will be with Dr. Posey Pronto in approximately 2 weeks time.    If you find that they have not been scheduled please call the Orthopaedic Appointment front desk at  [phone].      General Anesthesia, Adult, Care After These instructions provide you with information about caring for yourself after your procedure. Your health care provider may also give you more specific instructions. Your treatment has been planned according to current medical practices, but problems sometimes occur. Call your health care provider if you have any problems or questions after your procedure. What can I expect after the procedure? After the procedure, it is common to have:  Vomiting.  A sore throat.  Mental slowness.  It is common to feel:  Nauseous.  Cold or shivery.  Sleepy.  Tired.  Sore or achy, even in parts of your body where you did not have surgery.  Follow these instructions at home: For at least 24 hours after the procedure:  Do not: ? Participate in activities where you could fall or become injured. ? Drive. ? Use heavy machinery. ? Drink alcohol. ? Take sleeping pills or medicines that cause drowsiness. ? Make important decisions or sign legal documents. ? Take care of children on your own.  Rest. Eating and drinking  If you vomit, drink water, juice, or soup when you can drink without vomiting.  Drink enough fluid to keep your urine clear or pale yellow.  Make sure you have little or no nausea before eating solid foods.  Follow the diet recommended by your health care provider. General instructions  Have a responsible adult stay with you until you are awake and alert.  Return to your normal activities as told by your health care provider. Ask your health care provider what activities are safe for you.  Take over-the-counter and prescription medicines only as told by your health care provider.  If you smoke, do not smoke without supervision.  Keep all follow-up visits as told by your health care provider. This is important. Contact a health care provider if:  You continue to have nausea or vomiting at home, and  medicines are not helpful.  You cannot drink fluids or start eating again.  You cannot urinate after 8-12 hours.  You develop a skin rash.  You have fever.  You have increasing redness at the site of your procedure. Get help right away if:  You have difficulty breathing.  You have chest pain.  You have unexpected bleeding.  You feel that you are having a life-threatening or urgent problem. This information is not intended to replace advice given to you by your health care provider. Make sure you discuss any questions you have with your health care provider. Document Released: 09/23/2000 Document Revised: 11/20/2015 Document Reviewed: 06/01/2015 Elsevier Interactive Patient Education  [employer].

## 2018-01-27 NOTE — Transfer of Care (Signed)
Immediate Anesthesia Transfer of Care Note  Patient: Roberto Holder  Procedure(s) Performed: KNEE ARTHROTOMY MEDIAL MENISOUS ROOT REPAIR (Left )  Patient Location: PACU  Anesthesia Type: General LMA  Level of Consciousness: awake, alert  and patient cooperative  Airway and Oxygen Therapy: Patient Spontanous Breathing and Patient connected to supplemental oxygen  Post-op Assessment: Post-op Vital signs reviewed, Patient's Cardiovascular Status Stable, Respiratory Function Stable, Patent Airway and No signs of Nausea or vomiting  Post-op Vital Signs: Reviewed and stable  Complications: No apparent anesthesia complications

## 2018-01-28 ENCOUNTER — Encounter: Payer: Self-pay | Admitting: Orthopedic Surgery

## 2018-02-20 NOTE — Anesthesia Postprocedure Evaluation (Signed)
Anesthesia Post Note  Patient: Roberto Holder  Procedure(s) Performed: KNEE ARTHROTOMY MEDIAL MENISOUS ROOT REPAIR (Left )  Patient location during evaluation: PACU Anesthesia Type: General Level of consciousness: awake and alert Pain management: pain level controlled Vital Signs Assessment: post-procedure vital signs reviewed and stable Respiratory status: spontaneous breathing, nonlabored ventilation, respiratory function stable and patient connected to nasal cannula oxygen Cardiovascular status: blood pressure returned to baseline and stable Postop Assessment: no apparent nausea or vomiting Anesthetic complications: no    Trecia Rogers

## 2018-05-18 ENCOUNTER — Other Ambulatory Visit: Payer: Self-pay | Admitting: Orthopedic Surgery

## 2018-05-18 DIAGNOSIS — G8929 Other chronic pain: Secondary | ICD-10-CM

## 2018-05-18 DIAGNOSIS — M25562 Pain in left knee: Principal | ICD-10-CM

## 2018-05-22 ENCOUNTER — Ambulatory Visit
Admission: RE | Admit: 2018-05-22 | Discharge: 2018-05-22 | Disposition: A | Discharge status: Home / Self Care | Payer: Commercial Managed Care - HMO | Source: Ambulatory Visit | Attending: Orthopedic Surgery | Admitting: Orthopedic Surgery

## 2018-05-22 DIAGNOSIS — M25562 Pain in left knee: Secondary | ICD-10-CM | POA: Diagnosis not present

## 2018-05-22 DIAGNOSIS — G8929 Other chronic pain: Secondary | ICD-10-CM | POA: Insufficient documentation

## 2018-06-03 ENCOUNTER — Ambulatory Visit: Payer: 59

## 2019-03-02 ENCOUNTER — Encounter: Payer: Self-pay | Admitting: *Deleted

## 2019-03-02 ENCOUNTER — Other Ambulatory Visit: Payer: Self-pay

## 2019-03-02 ENCOUNTER — Emergency Department
Admission: EM | Admit: 2019-03-02 | Discharge: 2019-03-02 | Disposition: A | Discharge status: Home / Self Care | Payer: 59 | Attending: Emergency Medicine | Admitting: Emergency Medicine

## 2019-03-02 DIAGNOSIS — R55 Syncope and collapse: Secondary | ICD-10-CM | POA: Insufficient documentation

## 2019-03-02 LAB — BASIC METABOLIC PANEL
Anion gap: 7 (ref 5–15)
BUN: 14 mg/dL (ref 6–20)
CO2: 26 mmol/L (ref 22–32)
Calcium: 9.3 mg/dL (ref 8.9–10.3)
Chloride: 104 mmol/L (ref 98–111)
Creatinine, Ser: 1.01 mg/dL (ref 0.61–1.24)
GFR calc Af Amer: >60 mL/min (ref >60)
GFR calc non Af Amer: >60 mL/min (ref >60)
Glucose, Bld: 101 mg/dL — ABNORMAL HIGH (ref 70–99)
Potassium: 4.2 mmol/L (ref 3.5–5.1)
Sodium: 137 mmol/L (ref 135–145)

## 2019-03-02 LAB — URINALYSIS, COMPLETE (UACMP) WITH MICROSCOPIC
Bacteria, UA: NONE SEEN
Bilirubin Urine: NEGATIVE
Glucose, UA: NEGATIVE mg/dL
Hgb urine dipstick: NEGATIVE
Ketones, ur: NEGATIVE mg/dL
Leukocytes,Ua: NEGATIVE
Nitrite: NEGATIVE
Protein, ur: NEGATIVE mg/dL
Specific Gravity, Urine: 1.003 — ABNORMAL LOW (ref 1.005–1.030)
Squamous Epithelial / HPF: NONE SEEN (ref 0–5)
pH: 7 (ref 5.0–8.0)

## 2019-03-02 LAB — GLUCOSE, CAPILLARY: Glucose-Capillary: 99 mg/dL (ref 70–99)

## 2019-03-02 LAB — CBC
HCT: 43.4 % (ref 39.0–52.0)
Hemoglobin: 14.5 g/dL (ref 13.0–17.0)
MCH: 28.5 pg (ref 26.0–34.0)
MCHC: 33.4 g/dL (ref 30.0–36.0)
MCV: 85.3 fL (ref 80.0–100.0)
Platelets: 243 10*3/uL (ref 150–400)
RBC: 5.09 MIL/uL (ref 4.22–5.81)
RDW: 12.3 % (ref 11.5–15.5)
WBC: 8 10*3/uL (ref 4.0–10.5)
nRBC: 0 % (ref 0.0–0.2)

## 2019-03-02 LAB — TROPONIN I (HIGH SENSITIVITY): Troponin I (High Sensitivity): 3 ng/L (ref <18)

## 2019-03-02 NOTE — Discharge Instructions (Addendum)
Please seek medical attention for any high fevers, chest pain, shortness of breath, change in behavior, persistent vomiting, bloody stool or any other new or concerning symptoms.  

## 2019-03-02 NOTE — ED Triage Notes (Signed)
Patient states that approximately 45 minutes prior to arrival, patient was standing outside in the heat and had two near-syncopal episodes. Patient c/o nausea. Patient states he has been staying hydrated.

## 2019-03-02 NOTE — ED Provider Notes (Signed)
[facility] Emergency Department Provider Note   ____________________________________________   I have reviewed the triage vital signs and the nursing notes.   HISTORY  Chief Complaint Near Syncope   History limited by: Not Limited   HPI Roberto Holder is a 58 y.o. male who presents to the emergency department today because of concern for a near syncopal episode. The patient states that he was at work when his vision went grey and he fell down to the ground. It lasted roughly 1 second. He then went back to work and had another similar episode roughly 45 minutes later. He denies any associated chest pain or palpitations. Denies any shortness of breath. Was not exerting himself when these episodes occurred. Had never had similar episodes in the past. Ate and drank his normal amount earlier in the day. The patient denies any recent illness type symptoms.    Records reviewed.   Past Medical History:  Diagnosis Date  . Medical history non-contributory     There are no active problems to display for this patient.   Past Surgical History:  Procedure Laterality Date  . CHOLECYSTECTOMY    . GANGLION CYST EXCISION Right    wrist  . KNEE ARTHROTOMY Left 01/27/2018   Procedure: KNEE ARTHROTOMY MEDIAL MENISOUS ROOT REPAIR;  Surgeon: Leim Fabry, MD;  Location: [facility];  Service: Orthopedics;  Laterality: Left;    Prior to Admission medications   Medication Sig Start Date End Date Taking? Authorizing Provider  ondansetron (ZOFRAN ODT) 4 MG disintegrating tablet Take 1 tablet (4 mg total) by mouth every 8 (eight) hours as needed for nausea or vomiting. 01/27/18   Leim Fabry, MD    Allergies Tape  No family history on file.  Social History Social History   Tobacco Use  . Smoking status: Never Smoker  . Smokeless tobacco: Never Used  Substance Use Topics  . Alcohol use: Yes    Alcohol/week: 2.0 standard drinks    Types: 2 Cans of beer per  week    Comment:    . Drug use: No    Review of Systems Constitutional: No fever/chills Eyes: No visual changes. ENT: No sore throat. Cardiovascular: Denies chest pain. Respiratory: Denies shortness of breath. Gastrointestinal: No abdominal pain.  No nausea, no vomiting.  No diarrhea.   Genitourinary: Negative for dysuria. Musculoskeletal: Negative for back pain. Skin: Negative for rash. Neurological: Positive for near syncopal episode ____________________________________________   PHYSICAL EXAM:  VITAL SIGNS: ED Triage Vitals  Enc Vitals Group     BP 03/02/19 1734 (!) 166/92     Pulse Rate 03/02/19 1734 73     Resp 03/02/19 1734 18     Temp 03/02/19 1734 98.3 F (36.8 C)     Temp Source 03/02/19 1734 Oral     SpO2 03/02/19 1734 98 %     Weight --      Height --      Head Circumference --      Peak Flow --      Pain Score 03/02/19 1735 4    Constitutional: Alert and oriented.  Eyes: Conjunctivae are normal.  ENT      Head: Normocephalic and atraumatic.      Nose: No congestion/rhinnorhea.      Mouth/Throat: Mucous membranes are moist.      Neck: No stridor. Hematological/Lymphatic/Immunilogical: No cervical lymphadenopathy. Cardiovascular: Normal rate, regular rhythm.  No murmurs, rubs, or gallops.  Respiratory: Normal respiratory effort without tachypnea nor retractions.  Breath sounds are clear and equal bilaterally. No wheezes/rales/rhonchi. Gastrointestinal: Soft and non tender. No rebound. No guarding.  Genitourinary: Deferred Musculoskeletal: Normal range of motion in all extremities. No lower extremity edema. Neurologic:  Normal speech and language. No gross focal neurologic deficits are appreciated.  Skin:  Skin is warm, dry and intact. No rash noted. Psychiatric: Mood and affect are normal. Speech and behavior are normal. Patient exhibits appropriate insight and judgment.  ____________________________________________    LABS (pertinent  positives/negatives)  BMP wnl except glu 101 UA clear, spec grav 1.003, otherwise unremarkable CBC wbc 8.0, hgb 14.5, plt 243 Trop hs 3 ____________________________________________   EKG  I, Nance Pear, attending physician, personally viewed and interpreted this EKG  EKG Time: 1734 Rate: 65 Rhythm: normal sinus rhythm Axis: normal Intervals: qtc 391 QRS: narrow ST changes: no st elevation Impression: normal ekg ____________________________________________    RADIOLOGY  None  ____________________________________________   PROCEDURES  Procedures  ____________________________________________   INITIAL IMPRESSION / ASSESSMENT AND PLAN / ED COURSE  Pertinent labs & imaging results that were available during my care of the patient were reviewed by me and considered in my medical decision making (see chart for details).   Patient presented to the emergency department today because of concerns for near syncopal episodes.  Both were very brief and not accompanied by shortness of breath or chest pain.  Patient's work-up here with normal EKG.  Blood work without concerning anemia or electrolyte abnormality.  #-3.  This point unclear etiology the near syncopal episodes however does not appear to be cardiac related.  Doubt PE.  Will plan on discharging patient.  Discussed return precautions.  Discussed importance of follow-up.  ____________________________________________   FINAL CLINICAL IMPRESSION(S) / ED DIAGNOSES  Final diagnoses:  Near syncope     Note: This dictation was prepared with Dragon dictation. Any transcriptional errors that result from this process are unintentional     Nance Pear, MD 03/02/19 2137

## 2019-06-28 ENCOUNTER — Ambulatory Visit
Admission: EM | Admit: 2019-06-28 | Discharge: 2019-06-28 | Disposition: A | Discharge status: Home / Self Care | Payer: 59 | Attending: Internal Medicine | Admitting: Internal Medicine

## 2019-06-28 ENCOUNTER — Other Ambulatory Visit: Payer: Self-pay

## 2019-06-28 DIAGNOSIS — R0981 Nasal congestion: Secondary | ICD-10-CM | POA: Insufficient documentation

## 2019-06-28 DIAGNOSIS — Z7189 Other specified counseling: Secondary | ICD-10-CM | POA: Insufficient documentation

## 2019-06-28 DIAGNOSIS — Z9049 Acquired absence of other specified parts of digestive tract: Secondary | ICD-10-CM | POA: Insufficient documentation

## 2019-06-28 DIAGNOSIS — Z809 Family history of malignant neoplasm, unspecified: Secondary | ICD-10-CM | POA: Diagnosis not present

## 2019-06-28 DIAGNOSIS — R059 Cough, unspecified: Secondary | ICD-10-CM

## 2019-06-28 DIAGNOSIS — Z20828 Contact with and (suspected) exposure to other viral communicable diseases: Secondary | ICD-10-CM | POA: Diagnosis not present

## 2019-06-28 DIAGNOSIS — R05 Cough: Secondary | ICD-10-CM | POA: Insufficient documentation

## 2019-06-28 MED ORDER — HYDROCOD POLST-CPM POLST ER 10-8 MG/5ML PO SUER: 5.0000 mL | Freq: Every evening | ORAL | 0 refills | Status: DC | PRN

## 2019-06-28 MED ORDER — BENZONATATE 100 MG PO CAPS: 100.0000 mg | ORAL_CAPSULE | Freq: Three times a day (TID) | ORAL | 0 refills | Status: DC | PRN

## 2019-06-28 NOTE — ED Provider Notes (Signed)
[facility] ____________________________________________  Time seen: Approximately 10:45 AM  I have reviewed the triage vital signs and the nursing notes.   HISTORY  Chief Complaint Nasal Congestion   HPI Roberto Holder is a 58 y.o. male presenting for evaluation of 2 days of cough and congestion complaints.  Patient reports "I get this every year "and denies being around sick contacts.  States nasal congestion and cough.  States cough is occasionally productive of whitish phlegm.  States cough is disrupting sleep.  Unresolved with over-the-counter congestion medication. Denies chest pain or shortness of breath.  Denies fevers, vomiting, diarrhea, changes in taste or smell, sore throat.  Continues eat and drink well.  Denies other recent sickness.  Denies other complaints.   Past Medical History:  Diagnosis Date  . Medical history non-contributory     There are no problems to display for this patient.   Past Surgical History:  Procedure Laterality Date  . CHOLECYSTECTOMY    . GANGLION CYST EXCISION Right    wrist  . KNEE ARTHROTOMY Left 01/27/2018   Procedure: KNEE ARTHROTOMY MEDIAL MENISOUS ROOT REPAIR;  Surgeon: Leim Fabry, MD;  Location: [place];  Service: Orthopedics;  Laterality: Left;     No current facility-administered medications for this encounter.  Current Outpatient Medications:  .  benzonatate (TESSALON PERLES) 100 MG capsule, Take 1 capsule (100 mg total) by mouth 3 (three) times daily as needed for cough., Disp: 20 capsule, Rfl: 0 .  chlorpheniramine-HYDROcodone (TUSSIONEX PENNKINETIC ER) 10-8 MG/5ML SUER, Take 5 mLs by mouth at bedtime as needed for cough. do not drive or operate machinery while taking as can cause drowsiness., Disp: 40 mL, Rfl: 0 .  ondansetron (ZOFRAN ODT) 4 MG disintegrating tablet, Take 1 tablet (4 mg total) by mouth every 8 (eight) hours as needed for nausea or vomiting., Disp: 20 tablet, Rfl: 0  Allergies  Tape  Family History  Problem Relation Age of Onset  . Cancer Mother   . Cancer Father     Social History Social History   Tobacco Use  . Smoking status: Never Smoker  . Smokeless tobacco: Never Used  Substance Use Topics  . Alcohol use: Yes    Alcohol/week: 2.0 standard drinks    Types: 2 Cans of beer per week    Comment:    . Drug use: No    Review of Systems Constitutional: No fever ENT: No sore throat.  Positive congestion. Cardiovascular: Denies chest pain. Respiratory: Denies shortness of breath. Gastrointestinal: No abdominal pain.  No nausea, no vomiting.  No diarrhea.  Musculoskeletal: Negative for back pain. Skin: Negative for rash.   ____________________________________________   PHYSICAL EXAM:  VITAL SIGNS: ED Triage Vitals  Enc Vitals Group     BP 06/28/19 1022 (!) 148/112     Pulse Rate 06/28/19 1022 68     Resp 06/28/19 1022 18     Temp 06/28/19 1022 97.9 F (36.6 C)     Temp src --      SpO2 06/28/19 1022 98 %     Weight --      Height --      Head Circumference --      Peak Flow --      Pain Score 06/28/19 1020 0     Pain Loc --      Pain Edu? --      Excl. in [place]? --    Vitals:   06/28/19 1022 06/28/19 1046  BP: (!) 148/112 Marland Kitchen)  142/95  Pulse: 68   Resp: 18   Temp: 97.9 F (36.6 C)   SpO2: 98%     Constitutional: Alert and oriented. Well appearing and in no acute distress. Eyes: Conjunctivae are normal.  Head: Atraumatic. No sinus tenderness to palpation. No swelling. No erythema.  Nose:Nasal congestion   Mouth/Throat: Mucous membranes are moist. No pharyngeal erythema. No tonsillar swelling or exudate.  Neck: No stridor.  No cervical spine tenderness to palpation. Hematological/Lymphatic/Immunilogical: No cervical lymphadenopathy. Cardiovascular: Normal rate, regular rhythm. Grossly normal heart sounds.  Good peripheral circulation. Respiratory: Normal respiratory effort.  No retractions. No wheezes, rales or rhonchi. Good  air movement.  Dry intermittent cough noted in room. Musculoskeletal: Ambulatory with steady gait.  Neurologic:  Normal speech and language. No gait instability. Skin:  Skin appears warm, dry and intact. No rash noted. Psychiatric: Mood and affect are normal. Speech and behavior are normal.  ___________________________________________   LABS (all labs ordered are listed, but only abnormal results are displayed)  Labs Reviewed  NOVEL CORONAVIRUS, NAA (HOSP ORDER, SEND-OUT TO REF LAB; TAT 18-24 HRS)   __________________________________________________________________________________   PROCEDURES Procedures     INITIAL IMPRESSION / ASSESSMENT AND PLAN / ED COURSE  Pertinent labs & imaging results that were available during my care of the patient were reviewed by me and considered in my medical decision making (see chart for details).  Well-appearing patient.  No acute distress.  Suspect viral illness.  COVID-19 testing completed and advice given.  Rx as needed [employer] and as needed Tussionex.  Over-the-counter Mucinex.  Work note given.  Monitor blood pressure, patient reports he just had a physical and it was normal, reports this due to stress, encouraged to monitor.  Discussed indication, risks and benefits of medications with patient. Discussed follow up and return parameters including no resolution or any worsening concerns. Patient verbalized understanding and agreed to plan.   ____________________________________________   FINAL CLINICAL IMPRESSION(S) / ED DIAGNOSES  Final diagnoses:  Cough  Advice given about COVID-19 virus infection     ED Discharge Orders         Ordered    benzonatate (TESSALON PERLES) 100 MG capsule  3 times daily PRN     06/28/19 1043    chlorpheniramine-HYDROcodone (TUSSIONEX PENNKINETIC ER) 10-8 MG/5ML SUER  At bedtime PRN     06/28/19 1043           Note: This dictation was prepared with Dragon dictation along with smaller phrase  technology. Any transcriptional errors that result from this process are unintentional.         Marylene Land, NP 06/28/19 1047

## 2019-06-28 NOTE — Discharge Instructions (Addendum)
Take medication as prescribed.  Over-the-counter Mucinex.  Rest. Drink plenty of fluids.   Follow up with your primary care physician this week as needed. Return to Urgent care for new or worsening concerns.

## 2019-06-28 NOTE — ED Triage Notes (Signed)
Pt presents with a productive cough and nasal congestion x 2 days. Pt denies any other symptoms at this time.

## 2019-06-29 ENCOUNTER — Other Ambulatory Visit: Payer: BLUE CROSS/BLUE SHIELD

## 2019-06-29 LAB — NOVEL CORONAVIRUS, NAA (HOSP ORDER, SEND-OUT TO REF LAB; TAT 18-24 HRS): SARS-CoV-2, NAA: NOT DETECTED

## 2019-07-03 ENCOUNTER — Other Ambulatory Visit: Payer: Self-pay

## 2019-07-03 ENCOUNTER — Emergency Department
Admission: EM | Admit: 2019-07-03 | Discharge: 2019-07-03 | Disposition: A | Discharge status: Home / Self Care | Payer: 59 | Attending: Emergency Medicine | Admitting: Emergency Medicine

## 2019-07-03 ENCOUNTER — Emergency Department: Payer: 59

## 2019-07-03 ENCOUNTER — Encounter: Payer: Self-pay | Admitting: Emergency Medicine

## 2019-07-03 DIAGNOSIS — J209 Acute bronchitis, unspecified: Secondary | ICD-10-CM | POA: Insufficient documentation

## 2019-07-03 DIAGNOSIS — R05 Cough: Secondary | ICD-10-CM | POA: Diagnosis present

## 2019-07-03 MED ORDER — FEXOFENADINE-PSEUDOEPHED ER 60-120 MG PO TB12: 1.0000 | ORAL_TABLET | Freq: Two times a day (BID) | ORAL | 0 refills | Status: DC

## 2019-07-03 MED ORDER — AZITHROMYCIN 250 MG PO TABS: ORAL_TABLET | ORAL | 0 refills | Status: DC

## 2019-07-03 MED ORDER — METHYLPREDNISOLONE 4 MG PO TBPK: ORAL_TABLET | ORAL | 0 refills | Status: DC

## 2019-07-03 NOTE — Discharge Instructions (Signed)
Follow discharge care instruction take medication as directed. °

## 2019-07-03 NOTE — ED Triage Notes (Signed)
Cough x 1 week, states tested negative for COVID. No fever.
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
## 2019-07-03 NOTE — ED Notes (Signed)
See triage note  Presents with cough  States he was tested for COVID this week and was negative  States he has not had fever but is having a prod cough

## 2019-07-08 ENCOUNTER — Encounter: Payer: Self-pay | Admitting: Emergency Medicine

## 2019-07-08 ENCOUNTER — Other Ambulatory Visit: Payer: Self-pay

## 2019-07-08 ENCOUNTER — Ambulatory Visit
Admission: EM | Admit: 2019-07-08 | Discharge: 2019-07-08 | Disposition: A | Discharge status: Home / Self Care | Payer: 59 | Attending: Internal Medicine | Admitting: Internal Medicine

## 2019-07-08 DIAGNOSIS — R05 Cough: Secondary | ICD-10-CM | POA: Insufficient documentation

## 2019-07-08 DIAGNOSIS — R059 Cough, unspecified: Secondary | ICD-10-CM

## 2019-07-08 DIAGNOSIS — Z20822 Contact with and (suspected) exposure to covid-19: Secondary | ICD-10-CM | POA: Diagnosis present

## 2019-07-08 NOTE — ED Provider Notes (Signed)
[facility]    CSN: [id number] Arrival date & time: 07/08/19  1607      History   Chief Complaint Chief Complaint  Patient presents with  . Cough  . chest congestion    HPI Roberto Holder is a 59 y.o. male recently diagnosed with acute bronchitis and treated with a course of prednisone and azithromycin and labs to urgent care following exposure to an individual diagnosed with COVID-19 infection.  Patient tested negative for COVID-19 infection on 06/28/2019.  His cough and chest congestion is improved.  He denies any fever, chills, loss of taste or smell, body aches, nausea vomiting. HPI  Past Medical History:  Diagnosis Date  . Medical history non-contributory     There are no problems to display for this patient.   Past Surgical History:  Procedure Laterality Date  . CHOLECYSTECTOMY    . GANGLION CYST EXCISION Right    wrist  . KNEE ARTHROTOMY Left 01/27/2018   Procedure: KNEE ARTHROTOMY MEDIAL MENISOUS ROOT REPAIR;  Surgeon: Leim Fabry, MD;  Location: [place];  Service: Orthopedics;  Laterality: Left;       Home Medications    Prior to Admission medications   Medication Sig Start Date End Date Taking? Authorizing Provider  benzonatate (TESSALON PERLES) 100 MG capsule Take 1 capsule (100 mg total) by mouth 3 (three) times daily as needed for cough. 06/28/19  Yes Marylene Land, NP  fexofenadine-pseudoephedrine (ALLEGRA-D) 60-120 MG 12 hr tablet Take 1 tablet by mouth 2 (two) times daily. 07/03/19  Yes Sable Feil, PA-C  tadalafil (CIALIS) 10 MG tablet SMARTSIG:1 Tablet(s) By Mouth As Needed 06/10/19  Yes [provider]    Family History Family History  Problem Relation Age of Onset  . Cancer Mother        colon  . Hypertension Mother   . Cancer Father        colon  . Heart attack Father 74    Social History Social History   Tobacco Use  . Smoking status: Never Smoker  . Smokeless tobacco: Never Used  Substance  Use Topics  . Alcohol use: Yes    Alcohol/week: 2.0 standard drinks    Types: 2 Cans of beer per week    Comment:    . Drug use: No     Allergies   Alendronate and Tape   Review of Systems Review of Systems  Constitutional: Negative for activity change, chills, fatigue and fever.  HENT: Negative for congestion.   Respiratory: Positive for cough. Negative for choking, shortness of breath and wheezing.   Cardiovascular: Negative for chest pain.  Gastrointestinal: Negative for nausea and vomiting.  Neurological: Negative for dizziness, light-headedness and headaches.  Psychiatric/Behavioral: Negative for confusion and decreased concentration.     Physical Exam Triage Vital Signs ED Triage Vitals  Enc Vitals Group     BP 07/08/19 1636 (!) 149/97     Pulse Rate 07/08/19 1636 83     Resp 07/08/19 1636 18     Temp 07/08/19 1636 98.1 F (36.7 C)     Temp Source 07/08/19 1636 Oral     SpO2 07/08/19 1636 98 %     Weight 07/08/19 1636 200 lb (90.7 kg)     Height 07/08/19 1636 5' 8.5" (1.74 m)     Head Circumference --      Peak Flow --      Pain Score 07/08/19 1635 0     Pain Loc --  Pain Edu? --      Excl. in [place]? --    No data found.  Updated Vital Signs BP (!) 149/97 (BP Location: Left Arm)   Pulse 83   Temp 98.1 F (36.7 C) (Oral)   Resp 18   Ht 5' 8.5" (1.74 m)   Wt 90.7 kg   SpO2 98%   BMI 29.97 kg/m   Visual Acuity Right Eye Distance:   Left Eye Distance:   Bilateral Distance:    Right Eye Near:   Left Eye Near:    Bilateral Near:     Physical Exam Vitals and nursing note reviewed.  Constitutional:      General: He is not in acute distress.    Appearance: He is not ill-appearing.  Skin:    General: Skin is warm.     Capillary Refill: Capillary refill takes less than 2 seconds.     Coloration: Skin is not pale.     Findings: No bruising or erythema.  Neurological:     General: No focal deficit present.     Mental Status: He is alert.      Cranial Nerves: No cranial nerve deficit.     Sensory: No sensory deficit.  Psychiatric:        Mood and Affect: Mood normal.      UC Treatments / Results  Labs (all labs ordered are listed, but only abnormal results are displayed) Labs Reviewed  NOVEL CORONAVIRUS, NAA (HOSP ORDER, SEND-OUT TO REF LAB; TAT 18-24 HRS)    EKG   Radiology No results found.  Procedures Procedures (including critical care time)  Medications Ordered in UC Medications - No data to display  Initial Impression / Assessment and Plan / UC Course  I have reviewed the triage vital signs and the nursing notes.  Pertinent labs & imaging results that were available during my care of the patient were reviewed by me and considered in my medical decision making (see chart for details).     1.  COVID-19 testing following close exposure to COVID-19 positive individual: COVID-19 PCR testing has been sent Patient is encouraged to self isolate, wear mask and continue hand hygiene/respiratory etiquette. If test shows any abnormalities, patient will be contacted by the urgent care staff Patient is encouraged to sign up for MyChart so that he can access via medical records. Final Clinical Impressions(s) / UC Diagnoses   Final diagnoses:  Cough  Close exposure to COVID-19 virus   Discharge Instructions   None    ED Prescriptions    None     PDMP not reviewed this encounter.   Chase Picket, MD 07/08/19 [phone]

## 2019-07-08 NOTE — ED Triage Notes (Addendum)
Patient in today c/o continued cough and chest congestion. Patient seen 06/28/19 at [facility] ED on 07/03/19. Covid test on 06/28/19 was negative. Patient was given zpac and prednisone on 07/03/19 at [facility] ED. Patient states he is feeling some better, but now concerned because he was exposed to +covid patient on 07/05/19 at work.

## 2019-07-09 LAB — NOVEL CORONAVIRUS, NAA (HOSP ORDER, SEND-OUT TO REF LAB; TAT 18-24 HRS): SARS-CoV-2, NAA: NOT DETECTED

## 2019-09-11 ENCOUNTER — Ambulatory Visit: Payer: 59 | Attending: Internal Medicine

## 2019-09-11 DIAGNOSIS — Z23 Encounter for immunization: Secondary | ICD-10-CM

## 2019-09-11 NOTE — Progress Notes (Signed)
   Covid-19 Vaccination Clinic  Name:  Roberto Holder    MRN: [id number] DOB: 12/29/1960  09/11/2019  Mr. Roberto Holder was observed post Covid-19 immunization for 15 minutes without incident. He was provided with Vaccine Information Sheet and instruction to access the V-Safe system.   Mr. Roberto Holder was instructed to call 911 with any severe reactions post vaccine: Marland Kitchen Difficulty breathing  . Swelling of face and throat  . A fast heartbeat  . A bad rash all over body  . Dizziness and weakness   Immunizations Administered    Name Date Dose VIS Date Route   Pfizer COVID-19 Vaccine 09/11/2019  8:51 AM 0.3 mL 06/11/2019 Intramuscular   Manufacturer: [place]   Lot: [id number]   [place]: [id number]

## 2019-10-05 ENCOUNTER — Other Ambulatory Visit: Payer: Self-pay

## 2019-10-05 ENCOUNTER — Ambulatory Visit: Payer: 59 | Attending: Internal Medicine

## 2019-10-05 DIAGNOSIS — Z23 Encounter for immunization: Secondary | ICD-10-CM

## 2019-10-05 NOTE — Progress Notes (Signed)
   Covid-19 Vaccination Clinic  Name:  Roberto Holder    MRN: [id number] DOB: [birth year]  10/05/2019  Mr. Roberto Holder was observed post Covid-19 immunization for 15 minutes without incident. He was provided with Vaccine Information Sheet and instruction to access the V-Safe system.   Mr. Roberto Holder was instructed to call 911 with any severe reactions post vaccine: Marland Kitchen Difficulty breathing  . Swelling of face and throat  . A fast heartbeat  . A bad rash all over body  . Dizziness and weakness   Immunizations Administered    Name Date Dose VIS Date Route   Pfizer COVID-19 Vaccine 10/05/2019 10:54 AM 0.3 mL 06/11/2019 Intramuscular   Manufacturer: [place]   Lot: [id number]   [place]: [id number]

## 2019-10-06 ENCOUNTER — Other Ambulatory Visit: Payer: Self-pay

## 2019-10-06 ENCOUNTER — Encounter: Payer: Self-pay | Admitting: Emergency Medicine

## 2019-10-06 ENCOUNTER — Ambulatory Visit
Admission: EM | Admit: 2019-10-06 | Discharge: 2019-10-06 | Disposition: A | Discharge status: Home / Self Care | Payer: 59 | Attending: Family Medicine | Admitting: Family Medicine

## 2019-10-06 DIAGNOSIS — T50Z95A Adverse effect of other vaccines and biological substances, initial encounter: Secondary | ICD-10-CM

## 2019-10-06 DIAGNOSIS — R0602 Shortness of breath: Secondary | ICD-10-CM | POA: Diagnosis not present

## 2019-10-06 NOTE — ED Provider Notes (Signed)
[facility]    CSN: [id number] Arrival date & time: 10/06/19  0825      History   Chief Complaint Chief Complaint  Patient presents with  . Shortness of Breath  . Fever   HPI  59 year old male presents with the above complaints.  Patient reports that he got his second dose of his Covid vaccine yesterday.  He reports ongoing fever, shortness of breath, nausea, body aches, headaches.  He states that he had a bloody nose as well.  He does not feel like this is related.  He believes that these are side effects of his vaccine.  He has been taking Tylenol with improvement in fever.  Currently afebrile.  No other associated symptoms.  No other complaints.  Past Surgical History:  Procedure Laterality Date  . CHOLECYSTECTOMY    . GANGLION CYST EXCISION Right    wrist  . KNEE ARTHROTOMY Left 01/27/2018   Procedure: KNEE ARTHROTOMY MEDIAL MENISOUS ROOT REPAIR;  Surgeon: Leim Fabry, MD;  Location: [place];  Service: Orthopedics;  Laterality: Left;       Home Medications    Prior to Admission medications   Medication Sig Start Date End Date Taking? Authorizing Provider  tadalafil (CIALIS) 10 MG tablet SMARTSIG:1 Tablet(s) By Mouth As Needed 06/10/19  Yes [provider]  B Complex Vitamins (VITAMIN B COMPLEX PO) Take by mouth.    [provider]  benzonatate (TESSALON PERLES) 100 MG capsule Take 1 capsule (100 mg total) by mouth 3 (three) times daily as needed for cough. 06/28/19   Marylene Land, NP  Cholecalciferol 25 MCG (1000 UT) capsule Take by mouth.    [provider]  fexofenadine-pseudoephedrine (ALLEGRA-D) 60-120 MG 12 hr tablet Take 1 tablet by mouth 2 (two) times daily. 07/03/19   Sable Feil, PA-C    Family History Family History  Problem Relation Age of Onset  . Cancer Mother        colon  . Hypertension Mother   . Cancer Father        colon  . Heart attack Father 34    Social History Social History    Tobacco Use  . Smoking status: Never Smoker  . Smokeless tobacco: Never Used  Substance Use Topics  . Alcohol use: Yes    Alcohol/week: 2.0 standard drinks    Types: 2 Cans of beer per week    Comment:    . Drug use: No     Allergies   Alendronate and Tape   Review of Systems Review of Systems Per HPI  Physical Exam Triage Vital Signs ED Triage Vitals  Enc Vitals Group     BP 10/06/19 0847 (!) 141/88     Pulse Rate 10/06/19 0847 71     Resp 10/06/19 0847 18     Temp 10/06/19 0847 98.2 F (36.8 C)     Temp Source 10/06/19 0847 Oral     SpO2 10/06/19 0847 98 %     Weight 10/06/19 0843 197 lb (89.4 kg)     Height 10/06/19 0843 5\' 9"  (1.753 m)     Head Circumference --      Peak Flow --      Pain Score 10/06/19 0843 6     Pain Loc --      Pain Edu? --      Excl. in [place]? --    Updated Vital Signs BP (!) 141/88 (BP Location: Left Arm)   Pulse 71  Temp 98.2 F (36.8 C) (Oral)   Resp 18   Ht 5\' 9"  (1.753 m)   Wt 89.4 kg   SpO2 98%   BMI 29.09 kg/m   Visual Acuity Right Eye Distance:   Left Eye Distance:   Bilateral Distance:    Right Eye Near:   Left Eye Near:    Bilateral Near:     Physical Exam Vitals and nursing note reviewed.  Constitutional:      General: He is not in acute distress.    Appearance: Normal appearance. He is not ill-appearing.  HENT:     Head: Normocephalic and atraumatic.  Eyes:     General:        Right eye: No discharge.        Left eye: No discharge.     Conjunctiva/sclera: Conjunctivae normal.  Cardiovascular:     Rate and Rhythm: Normal rate and regular rhythm.     Heart sounds: No murmur.  Pulmonary:     Effort: Pulmonary effort is normal.     Breath sounds: Normal breath sounds. No wheezing, rhonchi or rales.  Neurological:     Mental Status: He is alert.  Psychiatric:        Mood and Affect: Mood normal.        Behavior: Behavior normal.    UC Treatments / Results  Labs (all labs ordered are listed, but  only abnormal results are displayed) Labs Reviewed - No data to display  EKG   Radiology No results found.  Procedures Procedures (including critical care time)  Medications Ordered in UC Medications - No data to display  Initial Impression / Assessment and Plan / UC Course  I have reviewed the triage vital signs and the nursing notes.  Pertinent labs & imaging results that were available during my care of the patient were reviewed by me and considered in my medical decision making (see chart for details).    59 year old male presents with adverse side effects from recent vaccine.  He is well-appearing.  Vitals stable.  Lungs clear.  Advised Tylenol and ibuprofen.  Supportive care.  Final Clinical Impressions(s) / UC Diagnoses   Final diagnoses:  Adverse effect of vaccine, initial encounter     Discharge Instructions     Rest.  Fluids.  Tylenol & Ibuprofen as needed.  If you worsen, please let us know.  Take care  Dr. Lacinda Axon    ED Prescriptions    None     PDMP not reviewed this encounter.   Thersa Salt [place], [place] 10/06/19 [phone]

## 2019-10-06 NOTE — Discharge Instructions (Signed)
Rest.  Fluids.  Tylenol & Ibuprofen as needed.  If you worsen, please let us know.  Take care  Dr. Lacinda Axon

## 2019-10-06 NOTE — ED Triage Notes (Signed)
Pt c/o fever, shortness of breath, epistaxis, nausea, body aches, headaches. He states he had his 2nd covid vaccine yesterday. He states his temperature was back to normal this morning.

## 2019-11-04 SURGERY — COLONOSCOPY WITH PROPOFOL
Anesthesia: General

## 2019-11-05 ENCOUNTER — Encounter: Payer: Self-pay | Admitting: Cardiology

## 2019-11-05 ENCOUNTER — Ambulatory Visit (INDEPENDENT_AMBULATORY_CARE_PROVIDER_SITE_OTHER): Payer: 59 | Admitting: Cardiology

## 2019-11-05 ENCOUNTER — Other Ambulatory Visit: Payer: Self-pay

## 2019-11-05 VITALS — BP 160/90 | HR 73 | Ht 68.5 in | Wt 212.1 lb

## 2019-11-05 DIAGNOSIS — I209 Angina pectoris, unspecified: Secondary | ICD-10-CM

## 2019-11-05 DIAGNOSIS — R030 Elevated blood-pressure reading, without diagnosis of hypertension: Secondary | ICD-10-CM | POA: Diagnosis not present

## 2019-11-05 DIAGNOSIS — R079 Chest pain, unspecified: Secondary | ICD-10-CM | POA: Diagnosis not present

## 2019-11-05 DIAGNOSIS — R0600 Dyspnea, unspecified: Secondary | ICD-10-CM | POA: Diagnosis not present

## 2019-11-05 DIAGNOSIS — R0609 Other forms of dyspnea: Secondary | ICD-10-CM

## 2019-11-05 MED ORDER — METOPROLOL TARTRATE 100 MG PO TABS: 100.0000 mg | ORAL_TABLET | Freq: Once | ORAL | 0 refills | Status: DC

## 2019-11-05 NOTE — Patient Instructions (Addendum)
Medication Instructions:  Your physician recommends that you continue on your current medications as directed. Please refer to the Current Medication list given to you today.  *If you need a refill on your cardiac medications before your next appointment, please call your pharmacy*  Lab Work: Your physician recommends that you return for lab work [place]. - BMET. - Please go to the [facility]. You will check in at the front desk to the right as you walk into the atrium. Valet Parking is offered if needed. - No appointment needed. You may go any day between 7 am and 6 pm.  If you have labs (blood work) drawn today and your tests are completely normal, you will receive your results only by: Marland Kitchen MyChart Message (if you have MyChart) OR . A paper copy in the mail If you have any lab test that is abnormal or we need to change your treatment, we will call you to review the results.   Testing/Procedures: 1- ECHOCARDIOGRAM - Your physician has requested that you have an echocardiogram. Echocardiography is a painless test that uses sound waves to create images of your heart. It provides your doctor with information about the size and shape of your heart and how well your heart's chambers and valves are working. This procedure takes approximately one hour. There are no restrictions for this procedure. You may get an IV, if needed, to receive an ultrasound enhancing agent through to better visualize your heart.    2- CARDIAC/CORONARY CTA  Your cardiac CT will be scheduled at one of the below locations:   [facility] [address] [place], [place] [address] (336) [place] [address] [place], [place] [address] [phone]  If scheduled at [facility], please arrive at the [facility] main entrance of [facility] 30 minutes prior to test start time. Proceed to the  [facility] Radiology Department (first floor) to check-in and test prep.  If scheduled at [facility], please arrive 15 mins early for check-in and test prep.  Please follow these instructions carefully (unless otherwise directed):  Hold all erectile dysfunction medications at least 3 days (72 hrs) prior to test.  On the Night Before the Test: . Be sure to Drink plenty of water. . Do not consume any caffeinated/decaffeinated beverages or chocolate 12 hours prior to your test. . Do not take any antihistamines 12 hours prior to your test.   On the Day of the Test: . Drink plenty of water. Do not drink any water within one hour of the test. . Do not eat any food 4 hours prior to the test. . You may take your regular medications prior to the test.  . Take metoprolol (Lopressor) two hours prior to test.       After the Test: . Drink plenty of water. . After receiving IV contrast, you may experience a mild flushed feeling. This is normal. . On occasion, you may experience a mild rash up to 24 hours after the test. This is not dangerous. If this occurs, you can take Benadryl 25 mg and increase your fluid intake. . If you experience trouble breathing, this can be serious. If it is severe call 911 IMMEDIATELY. If it is mild, please call our office. . If you take any of these medications: Glipizide/Metformin, Avandament, Glucavance, please do not take 48 hours after completing test unless  otherwise instructed.   Once we have confirmed authorization from your insurance company, we will call you to set up a date and time for your test.   For non-scheduling related questions, please contact the cardiac imaging nurse navigator should you have any questions/concerns: Marchia Bond, RN Navigator Cardiac Imaging [facility] [phone] office  For scheduling needs, including cancellations and rescheduling, please call [phone].      Follow-Up: At [facility], you and your health needs are our priority.  As part of our continuing mission to provide you with exceptional heart care, we have created designated Provider Care Teams.  These Care Teams include your primary Cardiologist (physician) and Advanced Practice Providers (APPs -  Physician Assistants and Nurse Practitioners) who all work together to provide you with the care you need, when you need it.  We recommend signing up for the patient portal called "MyChart".  Sign up information is provided on this After Visit Summary.  MyChart is used to connect with patients for Virtual Visits (Telemedicine).  Patients are able to view lab/test results, encounter notes, upcoming appointments, etc.  Non-urgent messages can be sent to your provider as well.   To learn more about what you can do with MyChart, go to [email or web address].    Your next appointment:   After testing.  The format for your next appointment:   In Person  Provider:   Kate Sable, MD

## 2019-11-05 NOTE — Progress Notes (Signed)
Cardiology Office Note:    Date:  11/05/2019   ID:  Roberto Holder, DOB Feb 18, 1961, MRN [id number]  PCP:  System, Pcp Not In  Cardiologist:  Kate Sable, MD  Electrophysiologist:  None   Referring MD: No ref. provider found   Chief Complaint  Patient presents with  . OTHER    Fam Hx MI c/o rapid heart beat and jaw pain. Meds reviewed verbally with pt.    History of Present Illness:    Roberto Holder is a 59 y.o. male with a hx of no significant past medical history who presents due to shortness of breath and family history of heart attacks.  Patient states having shortness of breath usually with exertion over the past 6 months.  Resting causes symptoms to go away.  He denies overt chest pain but states having a prominent heartbeat causing some chest discomfort.  He also notes occasional rapid heartbeats which occurs maybe once every 2 to 3 months.  He denies smoking.  He states having a strong family history of heart attacks with a brother having a heart attack last month in his early 74s, another brother had a heart attack 6 months ago also in his early 24s.  His mother also heart heart attack.  With patient's symptoms of prominent heartbeat, dyspnea, and family history he wanted to get checked out.  Past Medical History:  Diagnosis Date  . Medical history non-contributory     Past Surgical History:  Procedure Laterality Date  . CHOLECYSTECTOMY    . GANGLION CYST EXCISION Right    wrist  . KNEE ARTHROTOMY Left 01/27/2018   Procedure: KNEE ARTHROTOMY MEDIAL MENISOUS ROOT REPAIR;  Surgeon: Leim Fabry, MD;  Location: [facility];  Service: Orthopedics;  Laterality: Left;    Current Medications: Current Meds  Medication Sig  . B Complex Vitamins (VITAMIN B COMPLEX PO) Take by mouth daily.   . Cholecalciferol 25 MCG (1000 UT) capsule Take by mouth daily.   . magnesium oxide (MAG-OX) 400 MG tablet Take 400 mg by mouth daily.   . tadalafil (CIALIS) 10 MG tablet  SMARTSIG:1 Tablet(s) By Mouth As Needed  . zinc gluconate 50 MG tablet Take by mouth once a week.      Allergies:   Alendronate, Other, and Tape   Social History   Socioeconomic History  . Marital status: Single    Spouse name: Not on file  . Number of children: Not on file  . Years of education: Not on file  . Highest education level: Not on file  Occupational History  . Not on file  Tobacco Use  . Smoking status: Never Smoker  . Smokeless tobacco: Never Used  Substance and Sexual Activity  . Alcohol use: Yes    Alcohol/week: 2.0 standard drinks    Types: 2 Cans of beer per week    Comment: socially  . Drug use: No  . Sexual activity: Not on file  Other Topics Concern  . Not on file  Social History Narrative  . Not on file   Social Determinants of Health   Financial Resource Strain:   . Difficulty of Paying Living Expenses:   Food Insecurity:   . Worried About Charity fundraiser in the Last Year:   . Arboriculturist in the Last Year:   Transportation Needs:   . Film/video editor (Medical):   Marland Kitchen Lack of Transportation (Non-Medical):   Physical Activity:   . Days of Exercise per  Week:   . Minutes of Exercise per Session:   Stress:   . Feeling of Stress :   Social Connections:   . Frequency of Communication with Friends and Family:   . Frequency of Social Gatherings with Friends and Family:   . Attends Religious Services:   . Active Member of Clubs or Organizations:   . Attends Archivist Meetings:   Marland Kitchen Marital Status:      Family History: The patient's family history includes Cancer in his father and mother; Heart attack in his brother, brother, and mother; Heart attack (age of onset: 70) in his father; Heart disease in his brother, brother, father, mother, and sister; Hypertension in his mother; Stroke in his mother.  ROS:   Please see the history of present illness.     All other systems reviewed and are negative.  EKGs/Labs/Other Studies  Reviewed:    The following studies were reviewed today:   EKG:  EKG is  ordered today.  The ekg ordered today demonstrates normal sinus rhythm, normal ECG.  Recent Labs: 03/02/2019: BUN 14; Creatinine, Ser 1.01; Hemoglobin 14.5; Platelets 243; Potassium 4.2; Sodium 137  Recent Lipid Panel No results found for: CHOL, TRIG, HDL, CHOLHDL, VLDL, LDLCALC, LDLDIRECT  Physical Exam:    VS:  BP (!) 160/90 (BP Location: Right Arm, Patient Position: Sitting, Cuff Size: Normal)   Pulse 73   Ht 5' 8.5" (1.74 m)   Wt 212 lb 2 oz (96.2 kg)   SpO2 99%   BMI 31.78 kg/m     Wt Readings from Last 3 Encounters:  11/05/19 212 lb 2 oz (96.2 kg)  10/06/19 197 lb (89.4 kg)  07/08/19 200 lb (90.7 kg)     GEN:  Well nourished, well developed in no acute distress HEENT: Normal NECK: No JVD; No carotid bruits LYMPHATICS: No lymphadenopathy CARDIAC: RRR, no murmurs, rubs, gallops RESPIRATORY:  Clear to auscultation without rales, wheezing or rhonchi  ABDOMEN: Soft, non-tender, non-distended MUSCULOSKELETAL:  No edema; No deformity  SKIN: Warm and dry NEUROLOGIC:  Alert and oriented x 3 PSYCHIATRIC:  Normal affect   ASSESSMENT:    1. Dyspnea on exertion   2. Angina pectoris (HCC)   3. Elevated BP without diagnosis of hypertension   4. Chest pain, unspecified type    PLAN:    In order of problems listed above:  1. Patient with dyspnea on exertion over the past 6 months.  Symptoms could be an anginal equivalent or due to deconditioning.  Will evaluate with an echocardiogram for any structural abnormalities, get a coronary CTA to evaluate for presence of CAD especially with strong family history. 2. Some chest discomfort with prominent heartbeat, dyspnea on exertion could be an anginal equivalent.  Coronary CTA as above. 3. Patient blood pressure is elevated today.  He states blood pressure usually runs in the [id number] systolic.  Advised patient to keep BP log.  If blood pressure elevated at follow-up  visit, will consider starting blood pressure medications.  Follow-up after echocardiogram and coronary CTA.  This note was generated in part or whole with voice recognition software. Voice recognition is usually quite accurate but there are transcription errors that can and very often do occur. I apologize for any typographical errors that were not detected and corrected.  Medication Adjustments/Labs and Tests Ordered: Current medicines are reviewed at length with the patient today.  Concerns regarding medicines are outlined above.  Orders Placed This Encounter  Procedures  . CT CORONARY MORPH W/CTA  COR W/SCORE W/CA W/CM &/OR WO/CM  . CT CORONARY FRACTIONAL FLOW RESERVE DATA PREP  . CT CORONARY FRACTIONAL FLOW RESERVE FLUID ANALYSIS  . Basic metabolic panel  . EKG 12-Lead  . ECHOCARDIOGRAM COMPLETE   Meds ordered this encounter  Medications  . metoprolol tartrate (LOPRESSOR) 100 MG tablet    Sig: Take 1 tablet (100 mg total) by mouth once for 1 dose. Take 2 hours prior to CT.    Dispense:  1 tablet    Refill:  0    Patient Instructions  Medication Instructions:  Your physician recommends that you continue on your current medications as directed. Please refer to the Current Medication list given to you today.  *If you need a refill on your cardiac medications before your next appointment, please call your pharmacy*  Lab Work: Your physician recommends that you return for lab work [place]. - BMET. - Please go to the [facility]. You will check in at the front desk to the right as you walk into the atrium. Valet Parking is offered if needed. - No appointment needed. You may go any day between 7 am and 6 pm.  If you have labs (blood work) drawn today and your tests are completely normal, you will receive your results only by: Marland Kitchen MyChart Message (if you have MyChart) OR . A paper copy in the mail If you have any lab test that is abnormal or we need  to change your treatment, we will call you to review the results.   Testing/Procedures: 1- ECHOCARDIOGRAM - Your physician has requested that you have an echocardiogram. Echocardiography is a painless test that uses sound waves to create images of your heart. It provides your doctor with information about the size and shape of your heart and how well your heart's chambers and valves are working. This procedure takes approximately one hour. There are no restrictions for this procedure. You may get an IV, if needed, to receive an ultrasound enhancing agent through to better visualize your heart.    2- CARDIAC/CORONARY CTA  Your cardiac CT will be scheduled at one of the below locations:   [facility] [address] [place], [place] [address] (336) [place] [address] [place], [place] [address] [phone]  If scheduled at [facility], please arrive at the [facility] main entrance of [facility] 30 minutes prior to test start time. Proceed to the [facility] Radiology Department (first floor) to check-in and test prep.  If scheduled at [facility], please arrive 15 mins early for check-in and test prep.  Please follow these instructions carefully (unless otherwise directed):  Hold all erectile dysfunction medications at least 3 days (72 hrs) prior to test.  On the Night Before the Test: . Be sure to Drink plenty of water. . Do not consume any caffeinated/decaffeinated beverages or chocolate 12 hours prior to your test. . Do not take any antihistamines 12 hours prior to your test.   On the Day of the Test: . Drink plenty of water. Do not drink any water within one hour of the test. . Do not eat any food 4 hours prior to the test. . You may take your regular medications prior to the test.  . Take metoprolol (Lopressor) two hours prior to test.       After  the Test: . Drink plenty of water. Marland Kitchen  After receiving IV contrast, you may experience a mild flushed feeling. This is normal. . On occasion, you may experience a mild rash up to 24 hours after the test. This is not dangerous. If this occurs, you can take Benadryl 25 mg and increase your fluid intake. . If you experience trouble breathing, this can be serious. If it is severe call 911 IMMEDIATELY. If it is mild, please call our office. . If you take any of these medications: Glipizide/Metformin, Avandament, Glucavance, please do not take 48 hours after completing test unless otherwise instructed.   Once we have confirmed authorization from your insurance company, we will call you to set up a date and time for your test.   For non-scheduling related questions, please contact the cardiac imaging nurse navigator should you have any questions/concerns: Marchia Bond, RN Navigator Cardiac Imaging [facility] [phone] office  For scheduling needs, including cancellations and rescheduling, please call [phone].     Follow-Up: At [facility], you and your health needs are our priority.  As part of our continuing mission to provide you with exceptional heart care, we have created designated Provider Care Teams.  These Care Teams include your primary Cardiologist (physician) and Advanced Practice Providers (APPs -  Physician Assistants and Nurse Practitioners) who all work together to provide you with the care you need, when you need it.  We recommend signing up for the patient portal called "MyChart".  Sign up information is provided on this After Visit Summary.  MyChart is used to connect with patients for Virtual Visits (Telemedicine).  Patients are able to view lab/test results, encounter notes, upcoming appointments, etc.  Non-urgent messages can be sent to your provider as well.   To learn more about what you can do with MyChart, go to [email or web address].      Your next appointment:   After testing.  The format for your next appointment:   In Person  Provider:   Kate Sable, MD       Signed, Kate Sable, MD  11/05/2019 2:16 PM    [place]

## 2019-11-15 ENCOUNTER — Other Ambulatory Visit: Payer: Self-pay

## 2019-11-15 ENCOUNTER — Other Ambulatory Visit
Admission: RE | Admit: 2019-11-15 | Discharge: 2019-11-15 | Disposition: A | Discharge status: Home / Self Care | Payer: 59 | Attending: Cardiology | Admitting: Cardiology

## 2019-11-15 DIAGNOSIS — I209 Angina pectoris, unspecified: Secondary | ICD-10-CM

## 2019-11-15 DIAGNOSIS — R0600 Dyspnea, unspecified: Secondary | ICD-10-CM | POA: Insufficient documentation

## 2019-11-15 DIAGNOSIS — R0609 Other forms of dyspnea: Secondary | ICD-10-CM

## 2019-11-15 LAB — BASIC METABOLIC PANEL
Anion gap: 8 (ref 5–15)
BUN: 12 mg/dL (ref 6–20)
CO2: 27 mmol/L (ref 22–32)
Calcium: 9.1 mg/dL (ref 8.9–10.3)
Chloride: 100 mmol/L (ref 98–111)
Creatinine, Ser: 1.06 mg/dL (ref 0.61–1.24)
GFR calc Af Amer: >60 mL/min (ref >60)
GFR calc non Af Amer: >60 mL/min (ref >60)
Glucose, Bld: 104 mg/dL — ABNORMAL HIGH (ref 70–99)
Potassium: 4.5 mmol/L (ref 3.5–5.1)
Sodium: 135 mmol/L (ref 135–145)

## 2019-11-17 ENCOUNTER — Telehealth (HOSPITAL_COMMUNITY): Payer: Self-pay | Admitting: *Deleted

## 2019-11-17 NOTE — Telephone Encounter (Signed)
Reaching out to patient to offer assistance regarding upcoming cardiac imaging study; pt verbalizes understanding of appt date/time, parking situation and where to check in, pre-test NPO status and medications ordered, and verified current allergies; name and call back number provided for further questions should they arise  Jasiel Apachito Tai RN Navigator Cardiac Imaging [facility] [phone] office [phone] cell 

## 2019-11-18 ENCOUNTER — Ambulatory Visit
Admission: RE | Admit: 2019-11-18 | Discharge: 2019-11-18 | Disposition: A | Discharge status: Home / Self Care | Payer: 59 | Source: Ambulatory Visit | Attending: Cardiology | Admitting: Cardiology

## 2019-11-18 ENCOUNTER — Other Ambulatory Visit: Payer: Self-pay

## 2019-11-18 DIAGNOSIS — R079 Chest pain, unspecified: Secondary | ICD-10-CM

## 2019-11-18 DIAGNOSIS — I209 Angina pectoris, unspecified: Secondary | ICD-10-CM | POA: Insufficient documentation

## 2019-11-18 DIAGNOSIS — R0600 Dyspnea, unspecified: Secondary | ICD-10-CM | POA: Insufficient documentation

## 2019-11-18 DIAGNOSIS — R0609 Other forms of dyspnea: Secondary | ICD-10-CM

## 2019-11-18 MED ORDER — IOHEXOL 350 MG/ML SOLN
100.0000 mL | Freq: Once | INTRAVENOUS | Status: AC | PRN
  Administered 2019-11-18: 100 mL via INTRAVENOUS

## 2019-11-18 MED ORDER — NITROGLYCERIN 0.4 MG SL SUBL
0.8000 mg | SUBLINGUAL_TABLET | Freq: Once | SUBLINGUAL | Status: AC
  Administered 2019-11-18: 0.8 mg via SUBLINGUAL

## 2019-11-18 NOTE — Progress Notes (Signed)
Patient tolerated CT well. Gave patient a bottle of water to drink after. Ambulated to exit steady gait.

## 2019-12-15 ENCOUNTER — Other Ambulatory Visit: Payer: Self-pay

## 2019-12-15 ENCOUNTER — Ambulatory Visit (INDEPENDENT_AMBULATORY_CARE_PROVIDER_SITE_OTHER): Payer: 59

## 2019-12-15 DIAGNOSIS — R0600 Dyspnea, unspecified: Secondary | ICD-10-CM

## 2019-12-15 DIAGNOSIS — I209 Angina pectoris, unspecified: Secondary | ICD-10-CM

## 2019-12-15 DIAGNOSIS — R0609 Other forms of dyspnea: Secondary | ICD-10-CM

## 2019-12-15 MED ORDER — PERFLUTREN LIPID MICROSPHERE
1.0000 mL | INTRAVENOUS | Status: AC | PRN
  Administered 2019-12-15: 2 mL via INTRAVENOUS

## 2019-12-27 ENCOUNTER — Encounter: Payer: Self-pay | Admitting: Cardiology

## 2019-12-27 ENCOUNTER — Other Ambulatory Visit: Payer: Self-pay

## 2019-12-27 ENCOUNTER — Ambulatory Visit (INDEPENDENT_AMBULATORY_CARE_PROVIDER_SITE_OTHER): Payer: 59 | Admitting: Cardiology

## 2019-12-27 VITALS — BP 138/82 | HR 72 | Ht 68.5 in | Wt 213.2 lb

## 2019-12-27 DIAGNOSIS — H539 Unspecified visual disturbance: Secondary | ICD-10-CM | POA: Diagnosis not present

## 2019-12-27 DIAGNOSIS — R079 Chest pain, unspecified: Secondary | ICD-10-CM

## 2019-12-27 NOTE — Progress Notes (Signed)
Cardiology Office Note:    Date:  12/27/2019   ID:  Roberto Holder, DOB 11-10-1960, MRN [id number]  PCP:  Martin Majestic, FNP  Cardiologist:  Kate Sable, MD  Electrophysiologist:  None   Referring MD: No ref. provider found   Chief Complaint  Patient presents with  . OTHER    F/u CT/echo. Meds reviewed verbally with pt.    History of Present Illness:    Roberto Holder is a 59 y.o. male with  no significant past medical history who presents for follow-up.  He was last seen due to shortness of breath and family history of heart attacks.  He had exertional shortness of breath for 6 months.  Blood pressure was also elevated during last visit.  Due to strong family history of CAD, patient symptoms, coronary CTA was ordered.  He now presents for results.  Patient has occasional feelings of seeing bright lights for couple of seconds.  Denies dizziness or falls.  Symptoms are more related with exertion.    Past Medical History:  Diagnosis Date  . Medical history non-contributory     Past Surgical History:  Procedure Laterality Date  . CHOLECYSTECTOMY    . GANGLION CYST EXCISION Right    wrist  . KNEE ARTHROTOMY Left 01/27/2018   Procedure: KNEE ARTHROTOMY MEDIAL MENISOUS ROOT REPAIR;  Surgeon: Leim Fabry, MD;  Location: [facility];  Service: Orthopedics;  Laterality: Left;    Current Medications: Current Meds  Medication Sig  . B Complex Vitamins (VITAMIN B COMPLEX PO) Take by mouth daily.   . Cholecalciferol 25 MCG (1000 UT) capsule Take by mouth daily.   . magnesium oxide (MAG-OX) 400 MG tablet Take 400 mg by mouth daily.   . tadalafil (CIALIS) 10 MG tablet SMARTSIG:1 Tablet(s) By Mouth As Needed  . zinc gluconate 50 MG tablet Take by mouth once a week.      Allergies:   Alendronate, Other, and Tape   Social History   Socioeconomic History  . Marital status: Single    Spouse name: Not on file  . Number of children: Not on file  . Years of  education: Not on file  . Highest education level: Not on file  Occupational History  . Not on file  Tobacco Use  . Smoking status: Never Smoker  . Smokeless tobacco: Never Used  Vaping Use  . Vaping Use: Never used  Substance and Sexual Activity  . Alcohol use: Yes    Alcohol/week: 2.0 standard drinks    Types: 2 Cans of beer per week    Comment: socially  . Drug use: No  . Sexual activity: Not on file  Other Topics Concern  . Not on file  Social History Narrative  . Not on file   Social Determinants of Health   Financial Resource Strain:   . Difficulty of Paying Living Expenses:   Food Insecurity:   . Worried About Charity fundraiser in the Last Year:   . Arboriculturist in the Last Year:   Transportation Needs:   . Film/video editor (Medical):   Marland Kitchen Lack of Transportation (Non-Medical):   Physical Activity:   . Days of Exercise per Week:   . Minutes of Exercise per Session:   Stress:   . Feeling of Stress :   Social Connections:   . Frequency of Communication with Friends and Family:   . Frequency of Social Gatherings with Friends and Family:   . Attends  Religious Services:   . Active Member of Clubs or Organizations:   . Attends Archivist Meetings:   Marland Kitchen Marital Status:      Family History: The patient's family history includes Cancer in his father and mother; Heart attack in his brother, brother, and mother; Heart attack (age of onset: 66) in his father; Heart disease in his brother, brother, father, mother, and sister; Hypertension in his mother; Stroke in his mother.  ROS:   Please see the history of present illness.     All other systems reviewed and are negative.  EKGs/Labs/Other Studies Reviewed:    The following studies were reviewed today:   EKG:  EKG is  ordered today.  The ekg ordered today demonstrates normal sinus rhythm, normal ECG.  Recent Labs: 03/02/2019: Hemoglobin 14.5; Platelets 243 11/15/2019: BUN 12; Creatinine, Ser 1.06;  Potassium 4.5; Sodium 135  Recent Lipid Panel No results found for: CHOL, TRIG, HDL, CHOLHDL, VLDL, LDLCALC, LDLDIRECT  Physical Exam:    VS:  BP 138/82 (BP Location: Left Arm, Patient Position: Sitting, Cuff Size: Normal)   Pulse 72   Ht 5' 8.5" (1.74 m)   Wt 213 lb 4 oz (96.7 kg)   SpO2 98%   BMI 31.95 kg/m     Wt Readings from Last 3 Encounters:  12/27/19 213 lb 4 oz (96.7 kg)  11/05/19 212 lb 2 oz (96.2 kg)  10/06/19 197 lb (89.4 kg)     GEN:  Well nourished, well developed in no acute distress HEENT: Normal NECK: No JVD; No carotid bruits LYMPHATICS: No lymphadenopathy CARDIAC: RRR, no murmurs, rubs, gallops RESPIRATORY:  Clear to auscultation without rales, wheezing or rhonchi  ABDOMEN: Soft, non-tender, non-distended MUSCULOSKELETAL:  No edema; No deformity  SKIN: Warm and dry NEUROLOGIC:  Alert and oriented x 3 PSYCHIATRIC:  Normal affect   ASSESSMENT:    1. Chest pain, unspecified type   2. Vision abnormalities    PLAN:    In order of problems listed above:  1. Patient with dyspnea on exertion over a 59-month period.  Has risk factors of strong family history of CAD.  Echo showed normal systolic and diastolic function, EF 60 to 65%.  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  Patient reassured. 2. Nonspecific vision changes.  Not related with exertion.  No carotid bruits noted.  If symptoms persist, recommend following up with neurology for additional input.   Follow-up as needed  This note was generated in part or whole with voice recognition software. Voice recognition is usually quite accurate but there are transcription errors that can and very often do occur. I apologize for any typographical errors that were not detected and corrected.  Medication Adjustments/Labs and Tests Ordered: Current medicines are reviewed at length with the patient today.  Concerns regarding medicines  are outlined above.  Orders Placed This Encounter  Procedures  . EKG 12-Lead   No orders of the defined types were placed in this encounter.   Patient Instructions  Medication Instructions:  No Changes *If you need a refill on your cardiac medications before your next appointment, please call your pharmacy*   Lab Work: No Changes If you have labs (blood work) drawn today and your tests are completely normal, you will receive your results only by: Marland Kitchen MyChart Message (if you have MyChart) OR . A paper copy in the mail If you have any lab test that is abnormal  or we need to change your treatment, we will call you to review the results.   Testing/Procedures: None Ordered   Follow-Up: At [facility], you and your health needs are our priority.  As part of our continuing mission to provide you with exceptional heart care, we have created designated Provider Care Teams.  These Care Teams include your primary Cardiologist (physician) and Advanced Practice Providers (APPs -  Physician Assistants and Nurse Practitioners) who all work together to provide you with the care you need, when you need it.  We recommend signing up for the patient portal called "MyChart".  Sign up information is provided on this After Visit Summary.  MyChart is used to connect with patients for Virtual Visits (Telemedicine).  Patients are able to view lab/test results, encounter notes, upcoming appointments, etc.  Non-urgent messages can be sent to your provider as well.   To learn more about what you can do with MyChart, go to [email or web address].    Your next appointment:   Follow up as needed   The format for your next appointment:   In Person  Provider:   Kate Sable, MD   Other Instructions N/A     Signed, Kate Sable, MD  12/27/2019 1:13 PM    [place]

## 2019-12-27 NOTE — Patient Instructions (Signed)
Medication Instructions:  No Changes *If you need a refill on your cardiac medications before your next appointment, please call your pharmacy*   Lab Work: No Changes If you have labs (blood work) drawn today and your tests are completely normal, you will receive your results only by: Marland Kitchen MyChart Message (if you have MyChart) OR . A paper copy in the mail If you have any lab test that is abnormal or we need to change your treatment, we will call you to review the results.   Testing/Procedures: None Ordered   Follow-Up: At [facility], you and your health needs are our priority.  As part of our continuing mission to provide you with exceptional heart care, we have created designated Provider Care Teams.  These Care Teams include your primary Cardiologist (physician) and Advanced Practice Providers (APPs -  Physician Assistants and Nurse Practitioners) who all work together to provide you with the care you need, when you need it.  We recommend signing up for the patient portal called "MyChart".  Sign up information is provided on this After Visit Summary.  MyChart is used to connect with patients for Virtual Visits (Telemedicine).  Patients are able to view lab/test results, encounter notes, upcoming appointments, etc.  Non-urgent messages can be sent to your provider as well.   To learn more about what you can do with MyChart, go to [email or web address].    Your next appointment:   Follow up as needed   The format for your next appointment:   In Person  Provider:   Kate Sable, MD   Other Instructions N/A

## 2020-06-20 ENCOUNTER — Other Ambulatory Visit: Payer: Self-pay

## 2020-06-20 ENCOUNTER — Ambulatory Visit
Admission: EM | Admit: 2020-06-20 | Discharge: 2020-06-20 | Disposition: A | Discharge status: Home / Self Care | Payer: 59 | Attending: Family Medicine | Admitting: Family Medicine

## 2020-06-20 ENCOUNTER — Encounter: Payer: Self-pay | Admitting: Emergency Medicine

## 2020-06-20 DIAGNOSIS — B9789 Other viral agents as the cause of diseases classified elsewhere: Secondary | ICD-10-CM | POA: Diagnosis not present

## 2020-06-20 DIAGNOSIS — J988 Other specified respiratory disorders: Secondary | ICD-10-CM

## 2020-06-20 DIAGNOSIS — Z20822 Contact with and (suspected) exposure to covid-19: Secondary | ICD-10-CM | POA: Insufficient documentation

## 2020-06-20 LAB — RESP PANEL BY RT-PCR (FLU A&B, COVID) ARPGX2
Influenza A by PCR: NEGATIVE
Influenza B by PCR: NEGATIVE
SARS Coronavirus 2 by RT PCR: NEGATIVE

## 2020-06-20 NOTE — ED Provider Notes (Signed)
[facility]    CSN: [id number] Arrival date & time: 06/20/20  1016  History   Chief Complaint Chief Complaint  Patient presents with   Cough   Covid Exposure   HPI  59 year old male presents with the above complaints.  Patient reports recent exposure to coworkers with COVID-19.  He states that he developed cough and congestion last night.  His work is requiring him to be tested for COVID-19.  He has no fever.  No other respiratory symptoms.  He otherwise feels well.  Desires testing today.  No other complaints.  Past Surgical History:  Procedure Laterality Date   CHOLECYSTECTOMY     GANGLION CYST EXCISION Right    wrist   KNEE ARTHROTOMY Left 01/27/2018   Procedure: KNEE ARTHROTOMY MEDIAL MENISOUS ROOT REPAIR;  Surgeon: Leim Fabry, MD;  Location: [facility];  Service: Orthopedics;  Laterality: Left;    Home Medications    Prior to Admission medications   Medication Sig Start Date End Date Taking? Authorizing Provider  B Complex Vitamins (VITAMIN B COMPLEX PO) Take by mouth daily.    Yes [provider]  Cholecalciferol 25 MCG (1000 UT) capsule Take by mouth daily.    Yes [provider]  magnesium oxide (MAG-OX) 400 MG tablet Take 400 mg by mouth daily.    Yes [provider]  tadalafil (CIALIS) 10 MG tablet SMARTSIG:1 Tablet(s) By Mouth As Needed 06/10/19  Yes [provider]  zinc gluconate 50 MG tablet Take by mouth once a week.    Yes [provider]    Family History Family History  Problem Relation Age of Onset   Cancer Mother        colon   Hypertension Mother    Heart attack Mother    Heart disease Mother    Stroke Mother    Cancer Father        colon   Heart attack Father 88   Heart disease Father    Heart disease Sister    Heart attack Brother    Heart disease Brother    Heart attack Brother    Heart disease Brother     Social History Social History   Tobacco  Use   Smoking status: Never Smoker   Smokeless tobacco: Never Used  Scientific laboratory technician Use: Never used  Substance Use Topics   Alcohol use: Yes    Alcohol/week: 2.0 standard drinks    Types: 2 Cans of beer per week    Comment: socially   Drug use: No     Allergies   Alendronate, Other, and Tape   Review of Systems Review of Systems  Constitutional: Negative for fever.  Respiratory: Positive for cough.    Physical Exam Triage Vital Signs ED Triage Vitals  Enc Vitals Group     BP 06/20/20 1200 140/89     Pulse Rate 06/20/20 1200 69     Resp 06/20/20 1200 18     Temp 06/20/20 1200 98.2 F (36.8 C)     Temp Source 06/20/20 1200 Oral     SpO2 06/20/20 1200 98 %     Weight 06/20/20 1158 200 lb (90.7 kg)     Height 06/20/20 1158 5' 8.5" (1.74 m)     Head Circumference --      Peak Flow --      Pain Score 06/20/20 1158 0     Pain Loc --      Pain Edu? --  Excl. in GC? --    Updated Vital Signs BP 140/89 (BP Location: Right Arm)    Pulse 69    Temp 98.2 F (36.8 C) (Oral)    Resp 18    Ht 5' 8.5" (1.74 m)    Wt 90.7 kg    SpO2 98%    BMI 29.97 kg/m   Visual Acuity Right Eye Distance:   Left Eye Distance:   Bilateral Distance:    Right Eye Near:   Left Eye Near:    Bilateral Near:     Physical Exam Constitutional:      General: He is not in acute distress.    Appearance: Normal appearance. He is not ill-appearing.  HENT:     Head: Normocephalic and atraumatic.  Eyes:     General:        Right eye: No discharge.        Left eye: No discharge.     Conjunctiva/sclera: Conjunctivae normal.  Cardiovascular:     Rate and Rhythm: Normal rate and regular rhythm.  Pulmonary:     Effort: Pulmonary effort is normal.     Breath sounds: Normal breath sounds. No wheezing, rhonchi or rales.  Neurological:     Mental Status: He is alert.  Psychiatric:        Mood and Affect: Mood normal.        Behavior: Behavior normal.    UC Treatments / Results   Labs (all labs ordered are listed, but only abnormal results are displayed) Labs Reviewed  RESP PANEL BY RT-PCR (FLU A&B, COVID) [id number]    EKG   Radiology No results found.  Procedures Procedures (including critical care time)  Medications Ordered in UC Medications - No data to display  Initial Impression / Assessment and Plan / UC Course  I have reviewed the triage vital signs and the nursing notes.  Pertinent labs & imaging results that were available during my care of the patient were reviewed by me and considered in my medical decision making (see chart for details).    59 year old male presents with a viral respiratory illness.  Covid negative.  Flu negative.  Advised over-the-counter medication as needed.  May return to work.  Final Clinical Impressions(s) / UC Diagnoses   Final diagnoses:  Viral respiratory illness     Discharge Instructions     COVID negative.  OTC medication as needed.  Take care  Dr. Adriana Simas    ED Prescriptions    None     PDMP not reviewed this encounter.   Tommie Sams, DO 06/20/20 1255

## 2020-06-20 NOTE — ED Triage Notes (Signed)
Patient has been exposed to [place] from 2 co-workers. Patient c/o cough that started last night. Denies any other symptoms.

## 2020-06-20 NOTE — Discharge Instructions (Signed)
COVID negative.  OTC medication as needed.  Take care  Dr. Lacinda Axon

## 2020-06-25 ENCOUNTER — Other Ambulatory Visit: Payer: Self-pay

## 2020-06-25 ENCOUNTER — Ambulatory Visit
Admission: EM | Admit: 2020-06-25 | Discharge: 2020-06-25 | Disposition: A | Discharge status: Home / Self Care | Payer: 59 | Attending: Physician Assistant | Admitting: Physician Assistant

## 2020-06-25 ENCOUNTER — Encounter: Payer: Self-pay | Admitting: Emergency Medicine

## 2020-06-25 DIAGNOSIS — U071 COVID-19: Secondary | ICD-10-CM | POA: Diagnosis not present

## 2020-06-25 DIAGNOSIS — B349 Viral infection, unspecified: Secondary | ICD-10-CM | POA: Diagnosis present

## 2020-06-25 DIAGNOSIS — R059 Cough, unspecified: Secondary | ICD-10-CM | POA: Diagnosis present

## 2020-06-25 LAB — RESP PANEL BY RT-PCR (FLU A&B, COVID) ARPGX2
Influenza A by PCR: NEGATIVE
Influenza B by PCR: NEGATIVE
SARS Coronavirus 2 by RT PCR: POSITIVE — AB

## 2020-06-25 NOTE — ED Provider Notes (Signed)
[facility]    CSN: [id number] Arrival date & time: 06/25/20  1151      History   Chief Complaint Chief Complaint  Patient presents with   Covid Exposure   Cough    HPI Roberto Holder is a 59 y.o. male presenting for 5 day history of cough and congestion. He says he had a positive at home COVID test today.  Patient seen at [facility] urgent care 5 days ago and had negative respiratory panel.  Patient states he does not feel a lot worse in the last couple days, but his son did test positive for COVID-19 today.  Patient has been taking over-the-counter vitamin D, vitamin C and zinc.  Is also been taking decongestants and increasing rest and fluids.  Patient has been fully vaccinated for COVID-19 to have booster.  Patient states that he may consider taking ivermectin he has at home if he is positive for COVID-19.  He denies any associated fever, weakness, fatigue, sore throat, chest pain, breathing difficulty, vomiting, diarrhea.  No other complaints or concerns today.  HPI  Past Medical History:  Diagnosis Date   Medical history non-contributory     There are no problems to display for this patient.   Past Surgical History:  Procedure Laterality Date   CHOLECYSTECTOMY     GANGLION CYST EXCISION Right    wrist   KNEE ARTHROTOMY Left 01/27/2018   Procedure: KNEE ARTHROTOMY MEDIAL MENISOUS ROOT REPAIR;  Surgeon: Signa Kell, MD;  Location: [facility];  Service: Orthopedics;  Laterality: Left;       Home Medications    Prior to Admission medications   Medication Sig Start Date End Date Taking? Authorizing Provider  B Complex Vitamins (VITAMIN B COMPLEX PO) Take by mouth daily.     [provider]  Cholecalciferol 25 MCG (1000 UT) capsule Take by mouth daily.     [provider]  magnesium oxide (MAG-OX) 400 MG tablet Take 400 mg by mouth daily.     [provider]  tadalafil (CIALIS) 10 MG tablet SMARTSIG:1 Tablet(s) By  Mouth As Needed 06/10/19   [provider]  zinc gluconate 50 MG tablet Take by mouth once a week.     [provider]    Family History Family History  Problem Relation Age of Onset   Cancer Mother        colon   Hypertension Mother    Heart attack Mother    Heart disease Mother    Stroke Mother    Cancer Father        colon   Heart attack Father 64   Heart disease Father    Heart disease Sister    Heart attack Brother    Heart disease Brother    Heart attack Brother    Heart disease Brother     Social History Social History   Tobacco Use   Smoking status: Never Smoker   Smokeless tobacco: Never Used  Building services engineer Use: Never used  Substance Use Topics   Alcohol use: Yes    Alcohol/week: 2.0 standard drinks    Types: 2 Cans of beer per week    Comment: socially   Drug use: No     Allergies   Alendronate, Other, and Tape   Review of Systems Review of Systems  Constitutional: Negative for fatigue and fever.  HENT: Positive for congestion. Negative for rhinorrhea, sinus pressure, sinus pain and sore throat.  Respiratory: Positive for cough. Negative for shortness of breath.   Gastrointestinal: Negative for abdominal pain, diarrhea, nausea and vomiting.  Musculoskeletal: Negative for myalgias.  Neurological: Negative for weakness, light-headedness and headaches.  Hematological: Negative for adenopathy.     Physical Exam Triage Vital Signs ED Triage Vitals  Enc Vitals Group     BP 06/25/20 1424 (!) 143/81     Pulse Rate 06/25/20 1424 81     Resp 06/25/20 1424 16     Temp 06/25/20 1424 98.7 F (37.1 C)     Temp Source 06/25/20 1424 Oral     SpO2 06/25/20 1424 98 %     Weight 06/25/20 1422 200 lb (90.7 kg)     Height 06/25/20 1422 5\' 8"  (1.727 m)     Head Circumference --      Peak Flow --      Pain Score 06/25/20 1422 0     Pain Loc --      Pain Edu? --      Excl. in [place]? --    No data found.  Updated  Vital Signs BP (!) 143/81 (BP Location: Right Arm)    Pulse 81    Temp 98.7 F (37.1 C) (Oral)    Resp 16    Ht 5\' 8"  (1.727 m)    Wt 200 lb (90.7 kg)    SpO2 98%    BMI 30.41 kg/m    Physical Exam Vitals and nursing note reviewed.  Constitutional:      General: He is not in acute distress.    Appearance: Normal appearance. He is well-developed and well-nourished. He is not ill-appearing or diaphoretic.  HENT:     Head: Normocephalic and atraumatic.     Nose: Congestion and rhinorrhea present.     Mouth/Throat:     Mouth: Oropharynx is clear and moist and mucous membranes are normal. Mucous membranes are moist.     Pharynx: Oropharynx is clear. Uvula midline. No oropharyngeal exudate.     Tonsils: No tonsillar abscesses.  Eyes:     General: No scleral icterus.       Right eye: No discharge.        Left eye: No discharge.     Extraocular Movements: EOM normal.     Conjunctiva/sclera: Conjunctivae normal.     Pupils: Pupils are equal, round, and reactive to light.  Neck:     Thyroid: No thyromegaly.     Trachea: No tracheal deviation.  Cardiovascular:     Rate and Rhythm: Normal rate and regular rhythm.     Heart sounds: Normal heart sounds.  Pulmonary:     Effort: Pulmonary effort is normal. No respiratory distress.     Breath sounds: Normal breath sounds. No wheezing or rales.  Musculoskeletal:     Cervical back: Neck supple.  Skin:    General: Skin is warm and dry.     Findings: No rash.  Neurological:     General: No focal deficit present.     Mental Status: He is alert. Mental status is at baseline.     Motor: No weakness.     Gait: Gait normal.  Psychiatric:        Mood and Affect: Mood normal.        Behavior: Behavior normal.        Thought Content: Thought content normal.      UC Treatments / Results  Labs (all labs ordered are listed, but only abnormal results are displayed)  Labs Reviewed  RESP PANEL BY RT-PCR (FLU A&B, COVID) ARPGX2 - Abnormal; Notable  for the following components:      Result Value   SARS Coronavirus 2 by RT PCR POSITIVE (*)    All other components within normal limits    EKG   Radiology No results found.  Procedures Procedures (including critical care time)  Medications Ordered in UC Medications - No data to display  Initial Impression / Assessment and Plan / UC Course  I have reviewed the triage vital signs and the nursing notes.  Pertinent labs & imaging results that were available during my care of the patient were reviewed by me and considered in my medical decision making (see chart for details).   51-year-old male presenting for 5-day history of cough congestion.  States his symptoms about 4 COVID-19 today.  Patient had positive at home Covid test.  Vital signs are stable.  He is afebrile and is in no acute distress.  Oxygen saturation 98%.  On exam he has mild nasal congestion and rhinorrhea.  Chest is clear to auscultation heart regular rate and rhythm.  Respiratory panel obtained.  Positive for COVID-19.  Call to discuss results with patient.  Advised of CDC guidelines, isolation protocol and ED precautions.   Final Clinical Impressions(s) / UC Diagnoses   Final diagnoses:  Viral illness  COVID-19  Cough     Discharge Instructions     You have received COVID testing today either for positive exposure, concerning symptoms that could be related to COVID infection, screening purposes, or re-testing after confirmed positive.  Your test obtained today checks for active viral infection in the last 1-2 weeks. If your test is negative now, you can still test positive later. So, if you do develop symptoms you should either get re-tested and/or isolate x 10 days. Please follow CDC guidelines.  While Rapid antigen tests come back in 15-20 minutes, send out PCR/molecular test results typically come back within 24 hours. In the mean time, if you are symptomatic, assume this could be a positive test and  treat/monitor yourself as if you do have COVID.   We will call with test results. Please download the MyChart app and set up a profile to access test results.   If symptomatic, go home and rest. Push fluids. Take Tylenol as needed for discomfort. Gargle warm salt water. Throat lozenges. Take Mucinex DM or Robitussin for cough. Humidifier in bedroom to ease coughing. Warm showers. Also review the COVID handout for more information.  COVID-19 INFECTION: The incubation period of COVID-19 is approximately 14 days after exposure, with most symptoms developing in roughly 4-5 days. Symptoms may range in severity from mild to critically severe. Roughly 80% of those infected will have mild symptoms. People of any age may become infected with COVID-19 and have the ability to transmit the virus. The most common symptoms include: fever, fatigue, cough, body aches, headaches, sore throat, nasal congestion, shortness of breath, nausea, vomiting, diarrhea, changes in smell and/or taste.    COURSE OF ILLNESS Some patients may begin with mild disease which can progress quickly into critical symptoms. If your symptoms are worsening please call ahead to the Emergency Department and proceed there for further treatment. Recovery time appears to be roughly 1-2 weeks for mild symptoms and 3-6 weeks for severe disease.   GO IMMEDIATELY TO ER FOR FEVER YOU ARE UNABLE TO GET DOWN WITH TYLENOL, BREATHING PROBLEMS, CHEST PAIN, FATIGUE, LETHARGY, INABILITY TO EAT OR DRINK, ETC  QUARANTINE AND ISOLATION: To help decrease the spread of COVID-19 please remain isolated if you have COVID infection or are highly suspected to have COVID infection. This means -stay home and isolate to one room in the home if you live with others. Do not share a bed or bathroom with others while ill, sanitize and wipe down all countertops and keep common areas clean and disinfected. You may discontinue isolation if you have a mild case and are asymptomatic  10 days after symptom onset as long as you have been fever free >24 hours without having to take Motrin or Tylenol. If your case is more severe (meaning you develop pneumonia or are admitted in the hospital), you may have to isolate longer.   If you have been in close contact (within 6 feet) of someone diagnosed with COVID 19, you are advised to quarantine in your home for 14 days as symptoms can develop anywhere from 2-14 days after exposure to the virus. If you develop symptoms, you  must isolate.  Most current guidelines for COVID after exposure -isolate 10 days if you ARE NOT tested for COVID as long as symptoms do not develop -isolate 7 days if you are tested and remain asymptomatic -You do not necessarily need to be tested for COVID if you have + exposure and        develop   symptoms. Just isolate at home x10 days from symptom onset During this global pandemic, CDC advises to practice social distancing, try to stay at least 69ft away from others at all times. Wear a face covering. Wash and sanitize your hands regularly and avoid going anywhere that is not necessary.  KEEP IN MIND THAT THE COVID TEST IS NOT 100% ACCURATE AND YOU SHOULD STILL DO EVERYTHING TO PREVENT POTENTIAL SPREAD OF VIRUS TO OTHERS (WEAR MASK, WEAR GLOVES, [place] HANDS AND SANITIZE REGULARLY). IF INITIAL TEST IS NEGATIVE, THIS MAY NOT MEAN YOU ARE DEFINITELY NEGATIVE. MOST ACCURATE TESTING IS DONE 5-7 DAYS AFTER EXPOSURE.   It is not advised by CDC to get re-tested after receiving a positive COVID test since you can still test positive for weeks to months after you have already cleared the virus.   *If you have not been vaccinated for COVID, I strongly suggest you consider getting vaccinated as long as there are no contraindications.      ED Prescriptions    None     PDMP not reviewed this encounter.   Danton Clap, PA-C 06/25/20 1730

## 2020-06-25 NOTE — ED Triage Notes (Signed)
Patient c/o cough and congestion for the past 4 days.  Patient was seen here on 06/20/20.  Patient states that he did a home covid test today and was positive.

## 2020-06-25 NOTE — Discharge Instructions (Signed)

## 2020-07-14 ENCOUNTER — Other Ambulatory Visit: Payer: Self-pay

## 2020-07-14 ENCOUNTER — Emergency Department: Payer: 59

## 2020-07-14 ENCOUNTER — Emergency Department
Admission: EM | Admit: 2020-07-14 | Discharge: 2020-07-14 | Disposition: A | Discharge status: Home / Self Care | Payer: 59 | Attending: Emergency Medicine | Admitting: Emergency Medicine

## 2020-07-14 DIAGNOSIS — R42 Dizziness and giddiness: Secondary | ICD-10-CM | POA: Diagnosis present

## 2020-07-14 DIAGNOSIS — R0602 Shortness of breath: Secondary | ICD-10-CM | POA: Diagnosis not present

## 2020-07-14 DIAGNOSIS — Z5321 Procedure and treatment not carried out due to patient leaving prior to being seen by health care provider: Secondary | ICD-10-CM | POA: Diagnosis not present

## 2020-07-14 DIAGNOSIS — R200 Anesthesia of skin: Secondary | ICD-10-CM | POA: Insufficient documentation

## 2020-07-14 DIAGNOSIS — H538 Other visual disturbances: Secondary | ICD-10-CM | POA: Insufficient documentation

## 2020-07-14 LAB — COMPREHENSIVE METABOLIC PANEL
ALT: 31 U/L (ref 0–44)
AST: 23 U/L (ref 15–41)
Albumin: 4.2 g/dL (ref 3.5–5.0)
Alkaline Phosphatase: 60 U/L (ref 38–126)
Anion gap: 10 (ref 5–15)
BUN: 12 mg/dL (ref 6–20)
CO2: 26 mmol/L (ref 22–32)
Calcium: 9.4 mg/dL (ref 8.9–10.3)
Chloride: 104 mmol/L (ref 98–111)
Creatinine, Ser: 0.89 mg/dL (ref 0.61–1.24)
GFR, Estimated: >60 mL/min (ref >60)
Glucose, Bld: 102 mg/dL — ABNORMAL HIGH (ref 70–99)
Potassium: 4.1 mmol/L (ref 3.5–5.1)
Sodium: 140 mmol/L (ref 135–145)
Total Bilirubin: 1.5 mg/dL — ABNORMAL HIGH (ref 0.3–1.2)
Total Protein: 8.1 g/dL (ref 6.5–8.1)

## 2020-07-14 LAB — DIFFERENTIAL
Abs Immature Granulocytes: 0.03 10*3/uL (ref 0.00–0.07)
Basophils Absolute: 0 10*3/uL (ref 0.0–0.1)
Basophils Relative: 0 %
Eosinophils Absolute: 0.1 10*3/uL (ref 0.0–0.5)
Eosinophils Relative: 2 %
Immature Granulocytes: 0 %
Lymphocytes Relative: 28 %
Lymphs Abs: 2 10*3/uL (ref 0.7–4.0)
Monocytes Absolute: 0.8 10*3/uL (ref 0.1–1.0)
Monocytes Relative: 11 %
Neutro Abs: 4.1 10*3/uL (ref 1.7–7.7)
Neutrophils Relative %: 59 %

## 2020-07-14 LAB — URINALYSIS, COMPLETE (UACMP) WITH MICROSCOPIC
Bacteria, UA: NONE SEEN
Bilirubin Urine: NEGATIVE
Glucose, UA: NEGATIVE mg/dL
Hgb urine dipstick: NEGATIVE
Ketones, ur: NEGATIVE mg/dL
Leukocytes,Ua: NEGATIVE
Nitrite: NEGATIVE
Protein, ur: NEGATIVE mg/dL
Specific Gravity, Urine: 1.004 — ABNORMAL LOW (ref 1.005–1.030)
Squamous Epithelial / HPF: NONE SEEN (ref 0–5)
pH: 7 (ref 5.0–8.0)

## 2020-07-14 LAB — CBC
HCT: 45.6 % (ref 39.0–52.0)
Hemoglobin: 15.2 g/dL (ref 13.0–17.0)
MCH: 28.5 pg (ref 26.0–34.0)
MCHC: 33.3 g/dL (ref 30.0–36.0)
MCV: 85.4 fL (ref 80.0–100.0)
Platelets: 246 10*3/uL (ref 150–400)
RBC: 5.34 MIL/uL (ref 4.22–5.81)
RDW: 12.5 % (ref 11.5–15.5)
WBC: 7 10*3/uL (ref 4.0–10.5)
nRBC: 0 % (ref 0.0–0.2)

## 2020-07-14 LAB — CBG MONITORING, ED: Glucose-Capillary: 91 mg/dL (ref 70–99)

## 2020-07-14 LAB — APTT: aPTT: 27 seconds (ref 24–36)

## 2020-07-14 LAB — PROTIME-INR
INR: 1 (ref 0.8–1.2)
Prothrombin Time: 12.4 seconds (ref 11.4–15.2)

## 2020-07-14 NOTE — ED Notes (Signed)
LKW: a few days ago

## 2020-07-14 NOTE — ED Triage Notes (Addendum)
Reports right sided neck/jaw "numbness" and "feels like it's asleep" along with blurred vision and intermittent lightheadedness that started at 1AM today. No extremity weakness, speech clear, facial movement symmetrical. Denies CP. Reports mild SOB

## 2020-10-24 ENCOUNTER — Ambulatory Visit: Payer: Self-pay | Admitting: General Surgery

## 2020-11-29 ENCOUNTER — Ambulatory Visit
Admission: EM | Admit: 2020-11-29 | Discharge: 2020-11-29 | Disposition: A | Discharge status: Home / Self Care | Payer: 59 | Attending: Family Medicine | Admitting: Family Medicine

## 2020-11-29 ENCOUNTER — Other Ambulatory Visit: Payer: Self-pay

## 2020-11-29 DIAGNOSIS — R059 Cough, unspecified: Secondary | ICD-10-CM | POA: Insufficient documentation

## 2020-11-29 DIAGNOSIS — J069 Acute upper respiratory infection, unspecified: Secondary | ICD-10-CM | POA: Insufficient documentation

## 2020-11-29 DIAGNOSIS — Z20822 Contact with and (suspected) exposure to covid-19: Secondary | ICD-10-CM | POA: Insufficient documentation

## 2020-11-29 DIAGNOSIS — R0981 Nasal congestion: Secondary | ICD-10-CM | POA: Diagnosis not present

## 2020-11-29 LAB — RAPID INFLUENZA A&B ANTIGENS
Influenza A (ARMC): NEGATIVE
Influenza B (ARMC): NEGATIVE

## 2020-11-29 LAB — SARS CORONAVIRUS 2 (TAT 6-24 HRS): SARS Coronavirus 2: NEGATIVE

## 2020-11-29 MED ORDER — IPRATROPIUM BROMIDE 0.06 % NA SOLN: 2.0000 | Freq: Four times a day (QID) | NASAL | 12 refills | Status: DC

## 2020-11-29 NOTE — ED Triage Notes (Signed)
Patient states that he has been coughing since 2-3 days ago. Patient complains of fevers and body aches over the last few days. States that work would like him tested for covid and flu.

## 2020-11-29 NOTE — ED Provider Notes (Signed)
[facility]    CSN: [id number] Arrival date & time: 11/29/20  1015      History   Chief Complaint Chief Complaint  Patient presents with  . Cough    HPI Roberto Holder is a 60 y.o. male presenting for approximately 2 to 3-day history of cough and nasal congestion.  Patient states that he is occasionally felt feverish and achy over the past couple of days.  Patient states that his employer sent him home and advised him to get tested for COVID-19 and influenza.  Patient has personal history of COVID-19 at the end of December 2021.  He is vaccinated for COVID-19 x2, but no booster and does not intend to get a booster.  Patient believes he may have a sinus infection.  States that he was out around some trees that he is sensitive to and developed the congestion pretty soon after that.  He says he has performed a nasal lavage at home.  Patient denying any recorded fevers.  No sinus pain or ear pain.  No chest pain or shortness of breath.  No abdominal pain, nausea/vomiting or diarrhea.  Patient has no other concerns today.  HPI  Past Medical History:  Diagnosis Date  . Medical history non-contributory     There are no problems to display for this patient.   Past Surgical History:  Procedure Laterality Date  . CHOLECYSTECTOMY    . GANGLION CYST EXCISION Right    wrist  . KNEE ARTHROTOMY Left 01/27/2018   Procedure: KNEE ARTHROTOMY MEDIAL MENISOUS ROOT REPAIR;  Surgeon: Leim Fabry, MD;  Location: [place];  Service: Orthopedics;  Laterality: Left;       Home Medications    Prior to Admission medications   Medication Sig Start Date End Date Taking? Authorizing Provider  B Complex Vitamins (VITAMIN B COMPLEX PO) Take by mouth daily.    Yes [provider]  Cholecalciferol 25 MCG (1000 UT) capsule Take by mouth daily.    Yes [provider]  magnesium oxide (MAG-OX) 400 MG tablet Take 400 mg by mouth daily.    Yes [provider]   tadalafil (CIALIS) 10 MG tablet SMARTSIG:1 Tablet(s) By Mouth As Needed 06/10/19  Yes [provider]  zinc gluconate 50 MG tablet Take by mouth once a week.    Yes [provider]    Family History Family History  Problem Relation Age of Onset  . Cancer Mother        colon  . Hypertension Mother   . Heart attack Mother   . Heart disease Mother   . Stroke Mother   . Cancer Father        colon  . Heart attack Father 8  . Heart disease Father   . Heart disease Sister   . Heart attack Brother   . Heart disease Brother   . Heart attack Brother   . Heart disease Brother     Social History Social History   Tobacco Use  . Smoking status: Never Smoker  . Smokeless tobacco: Never Used  Vaping Use  . Vaping Use: Never used  Substance Use Topics  . Alcohol use: Yes    Alcohol/week: 2.0 standard drinks    Types: 2 Cans of beer per week    Comment: socially  . Drug use: No     Allergies   Alendronate, Other, and Tape   Review of Systems Review of Systems  Constitutional: Positive for fatigue. Negative for  fever.  HENT: Positive for congestion and rhinorrhea. Negative for sinus pressure, sinus pain and sore throat.   Respiratory: Positive for cough. Negative for shortness of breath.   Cardiovascular: Negative for chest pain.  Gastrointestinal: Negative for abdominal pain, diarrhea, nausea and vomiting.  Musculoskeletal: Positive for myalgias.  Neurological: Negative for weakness, light-headedness and headaches.  Hematological: Negative for adenopathy.     Physical Exam Triage Vital Signs ED Triage Vitals  Enc Vitals Group     BP 11/29/20 1025 (!) 153/97     Pulse Rate 11/29/20 1025 73     Resp 11/29/20 1025 18     Temp 11/29/20 1025 97.7 F (36.5 C)     Temp Source 11/29/20 1025 Oral     SpO2 11/29/20 1025 100 %     Weight 11/29/20 1024 213 lb (96.6 kg)     Height --      Head Circumference --      Peak Flow --      Pain Score 11/29/20  1023 2     Pain Loc --      Pain Edu? --      Excl. in [place]? --    No data found.  Updated Vital Signs BP (!) 153/97 (BP Location: Left Arm)   Pulse 73   Temp 97.7 F (36.5 C) (Oral)   Resp 18   Wt 213 lb (96.6 kg)   SpO2 100%   BMI 32.39 kg/m       Physical Exam Vitals and nursing note reviewed.  Constitutional:      General: He is not in acute distress.    Appearance: Normal appearance. He is well-developed. He is not ill-appearing or diaphoretic.  HENT:     Head: Normocephalic and atraumatic.     Nose: Congestion and rhinorrhea present.     Mouth/Throat:     Mouth: Mucous membranes are moist.     Pharynx: Oropharynx is clear. Uvula midline. No oropharyngeal exudate.     Tonsils: No tonsillar abscesses.  Eyes:     General: No scleral icterus.       Right eye: No discharge.        Left eye: No discharge.     Conjunctiva/sclera: Conjunctivae normal.  Neck:     Thyroid: No thyromegaly.     Trachea: No tracheal deviation.  Cardiovascular:     Rate and Rhythm: Normal rate and regular rhythm.     Heart sounds: Normal heart sounds.  Pulmonary:     Effort: Pulmonary effort is normal. No respiratory distress.     Breath sounds: Normal breath sounds. No wheezing, rhonchi or rales.  Musculoskeletal:     Cervical back: Normal range of motion and neck supple.  Lymphadenopathy:     Cervical: No cervical adenopathy.  Skin:    General: Skin is warm and dry.     Findings: No rash.  Neurological:     General: No focal deficit present.     Mental Status: He is alert. Mental status is at baseline.     Motor: No weakness.     Gait: Gait normal.  Psychiatric:        Mood and Affect: Mood normal.        Behavior: Behavior normal.        Thought Content: Thought content normal.      UC Treatments / Results  Labs (all labs ordered are listed, but only abnormal results are displayed) Labs Reviewed  RAPID INFLUENZA A&B ANTIGENS  SARS CORONAVIRUS 2 (TAT 6-24 HRS)     EKG   Radiology No results found.  Procedures Procedures (including critical care time)  Medications Ordered in UC Medications - No data to display  Initial Impression / Assessment and Plan / UC Course  I have reviewed the triage vital signs and the nursing notes.  Pertinent labs & imaging results that were available during my care of the patient were reviewed by me and considered in my medical decision making (see chart for details).   60 year old male presenting for cough and congestion starting 2 to 3 days ago.  Vital signs are stable in the clinic.  Blood pressure is little elevated at 153/97.  He is afebrile.  He is overall well-appearing.  Mild nasal congestion and clear rhinorrhea on exam.  His chest is clear to auscultation and heart regular rate and rhythm.  Rapid flu test obtained today.  Negative.  COVID PCR test obtained.  Current CDC guidelines, isolation protocol and ED precautions reviewed with patient.  Advised him that his symptoms are consistent with a viral URI.  Encourage supportive care with increasing rest and fluids.  If he believes this could potentially be allergy related he can try over-the-counter Zyrtec-D or Claritin-D and use Flonase as well as continue with the nasal lavage.  Advised to follow-up with [place] as needed for any worsening symptoms or if he is not feeling better after 7 to 10 days.  Work note given.  Final Clinical Impressions(s) / UC Diagnoses   Final diagnoses:  Viral upper respiratory tract infection  Cough  Nasal congestion     Discharge Instructions     -Negative flu test.  -COVID test will be back tomorrow  URI/COLD SYMPTOMS: Your exam today is consistent with a viral illness. Antibiotics are not indicated at this time. Use medications as directed, including cough syrup, nasal saline, and decongestants. Your symptoms should improve over the next few days and resolve within 7-10 days. Increase rest and fluids. F/u if symptoms  worsen or predominate such as sore throat, ear pain, productive cough, shortness of breath, or if you develop high fevers or worsening fatigue over the next several days.      ED Prescriptions    None     PDMP not reviewed this encounter.   Danton Clap, PA-C 11/29/20 1118

## 2020-11-29 NOTE — Discharge Instructions (Signed)
-  Negative flu test.  -COVID test will be back tomorrow  URI/COLD SYMPTOMS: Your exam today is consistent with a viral illness. Antibiotics are not indicated at this time. Use medications as directed, including cough syrup, nasal saline, and decongestants. Your symptoms should improve over the next few days and resolve within 7-10 days. Increase rest and fluids. F/u if symptoms worsen or predominate such as sore throat, ear pain, productive cough, shortness of breath, or if you develop high fevers or worsening fatigue over the next several days.

## 2021-03-08 ENCOUNTER — Other Ambulatory Visit: Payer: Self-pay

## 2021-03-08 ENCOUNTER — Ambulatory Visit
Admission: EM | Admit: 2021-03-08 | Discharge: 2021-03-08 | Disposition: A | Discharge status: Home / Self Care | Payer: 59 | Attending: Physician Assistant | Admitting: Physician Assistant

## 2021-03-08 DIAGNOSIS — Z888 Allergy status to other drugs, medicaments and biological substances status: Secondary | ICD-10-CM | POA: Diagnosis not present

## 2021-03-08 DIAGNOSIS — R0982 Postnasal drip: Secondary | ICD-10-CM | POA: Diagnosis not present

## 2021-03-08 DIAGNOSIS — J3489 Other specified disorders of nose and nasal sinuses: Secondary | ICD-10-CM | POA: Diagnosis not present

## 2021-03-08 DIAGNOSIS — J029 Acute pharyngitis, unspecified: Secondary | ICD-10-CM | POA: Diagnosis not present

## 2021-03-08 DIAGNOSIS — R059 Cough, unspecified: Secondary | ICD-10-CM | POA: Diagnosis not present

## 2021-03-08 DIAGNOSIS — Z20822 Contact with and (suspected) exposure to covid-19: Secondary | ICD-10-CM | POA: Diagnosis not present

## 2021-03-08 DIAGNOSIS — R0981 Nasal congestion: Secondary | ICD-10-CM

## 2021-03-08 LAB — SARS CORONAVIRUS 2 (TAT 6-24 HRS): SARS Coronavirus 2: NEGATIVE

## 2021-03-08 NOTE — ED Provider Notes (Signed)
[facility]    CSN: [id number] Arrival date & time: 03/08/21  0840      History   Chief Complaint Chief Complaint  Patient presents with   Cough   Nasal Congestion    HPI Roberto Holder is a 60 y.o. male.   60 year old male presents with sore throat, nasal congestion and cough for the past 2 to 3 days. Had a low grade fever for about 2 days but has resolved. Now having more post nasal drainage and sinus pressure. Denies any GI symptoms. Has been performing sinus lavage with good success. Took home COVID 19 test and was negative. Other employees and their relatives at work have been positive for [place] 19. His boss requests negative COVID 19 test to return to work. No other chronic health issues. Takes vitamin/supplements daily.   The history is provided by the patient.   Past Medical History:  Diagnosis Date   Medical history non-contributory     There are no problems to display for this patient.   Past Surgical History:  Procedure Laterality Date   CHOLECYSTECTOMY     GANGLION CYST EXCISION Right    wrist   KNEE ARTHROTOMY Left 01/27/2018   Procedure: KNEE ARTHROTOMY MEDIAL MENISOUS ROOT REPAIR;  Surgeon: Leim Fabry, MD;  Location: [place];  Service: Orthopedics;  Laterality: Left;       Home Medications    Prior to Admission medications   Medication Sig Start Date End Date Taking? Authorizing Provider  B Complex Vitamins (VITAMIN B COMPLEX PO) Take by mouth daily.    Yes [provider]  Cholecalciferol 25 MCG (1000 UT) capsule Take by mouth daily.    Yes [provider]  magnesium oxide (MAG-OX) 400 MG tablet Take 400 mg by mouth daily.    Yes [provider]  tadalafil (CIALIS) 10 MG tablet SMARTSIG:1 Tablet(s) By Mouth As Needed 06/10/19  Yes [provider]  zinc gluconate 50 MG tablet Take by mouth once a week.    Yes [provider]  ipratropium (ATROVENT) 0.06 % nasal spray Place 2  sprays into both nostrils 4 (four) times daily. 11/29/20   Danton Clap, PA-C    Family History Family History  Problem Relation Age of Onset   Cancer Mother        colon   Hypertension Mother    Heart attack Mother    Heart disease Mother    Stroke Mother    Cancer Father        colon   Heart attack Father 52   Heart disease Father    Heart disease Sister    Heart attack Brother    Heart disease Brother    Heart attack Brother    Heart disease Brother     Social History Social History   Tobacco Use   Smoking status: Never   Smokeless tobacco: Never  Vaping Use   Vaping Use: Never used  Substance Use Topics   Alcohol use: Yes    Alcohol/week: 2.0 standard drinks    Types: 2 Cans of beer per week    Comment: socially   Drug use: No     Allergies   Alendronate, Other, and Tape   Review of Systems Review of Systems  Constitutional:  Positive for fatigue and fever. Negative for activity change, appetite change and chills.  HENT:  Positive for congestion, postnasal drip, rhinorrhea, sinus pressure, sinus pain and sore throat. Negative for ear  discharge, ear pain, facial swelling, mouth sores, nosebleeds and trouble swallowing.   Eyes:  Negative for pain, discharge, redness and itching.  Respiratory:  Positive for cough. Negative for chest tightness, shortness of breath and wheezing.   Gastrointestinal:  Negative for nausea and vomiting.  Musculoskeletal:  Negative for arthralgias, myalgias, neck pain and neck stiffness.  Skin:  Negative for color change and rash.  Allergic/Immunologic: Negative for environmental allergies, food allergies and immunocompromised state.  Neurological:  Negative for dizziness, seizures, syncope, weakness, light-headedness, numbness and headaches.  Hematological:  Negative for adenopathy. Does not bruise/bleed easily.    Physical Exam Triage Vital Signs ED Triage Vitals  Enc Vitals Group     BP 03/08/21 0913 137/83     Pulse Rate  03/08/21 0913 80     Resp 03/08/21 0913 16     Temp 03/08/21 0913 98.2 F (36.8 C)     Temp Source 03/08/21 0913 Oral     SpO2 03/08/21 0913 98 %     Weight 03/08/21 0912 198 lb (89.8 kg)     Height 03/08/21 0912 '5\' 9"'$  (1.753 m)     Head Circumference --      Peak Flow --      Pain Score 03/08/21 0912 0     Pain Loc --      Pain Edu? --      Excl. in [place]? --    No data found.  Updated Vital Signs BP 137/83 (BP Location: Left Arm)   Pulse 80   Temp 98.2 F (36.8 C) (Oral)   Resp 16   Ht '5\' 9"'$  (1.753 m)   Wt 198 lb (89.8 kg)   SpO2 98%   BMI 29.24 kg/m   Visual Acuity Right Eye Distance:   Left Eye Distance:   Bilateral Distance:    Right Eye Near:   Left Eye Near:    Bilateral Near:     Physical Exam Vitals and nursing note reviewed.  Constitutional:      General: He is awake. He is not in acute distress.    Appearance: He is well-developed and well-groomed.     Comments: He is sitting on the exam table in no acute distress but appears tired.   HENT:     Head: Normocephalic and atraumatic.     Right Ear: Hearing, tympanic membrane, ear canal and external ear normal.     Left Ear: Hearing, tympanic membrane, ear canal and external ear normal.     Nose: Congestion present.     Right Sinus: Maxillary sinus tenderness and frontal sinus tenderness present.     Left Sinus: Maxillary sinus tenderness and frontal sinus tenderness present.     Mouth/Throat:     Lips: Pink.     Mouth: Mucous membranes are moist.     Pharynx: Uvula midline. Posterior oropharyngeal erythema present. No pharyngeal swelling, oropharyngeal exudate or uvula swelling.  Eyes:     Extraocular Movements: Extraocular movements intact.     Conjunctiva/sclera: Conjunctivae normal.  Cardiovascular:     Rate and Rhythm: Normal rate and regular rhythm.     Heart sounds: Normal heart sounds. No murmur heard. Pulmonary:     Effort: Pulmonary effort is normal. No respiratory distress.     Breath  sounds: Normal breath sounds and air entry. No decreased air movement. No decreased breath sounds, wheezing, rhonchi or rales.  Musculoskeletal:        General: Normal range of motion.  Cervical back: Normal range of motion and neck supple.  Lymphadenopathy:     Cervical: No cervical adenopathy.  Skin:    General: Skin is warm and dry.     Capillary Refill: Capillary refill takes less than 2 seconds.     Findings: No rash.  Neurological:     General: No focal deficit present.     Mental Status: He is alert and oriented to person, place, and time.  Psychiatric:        Mood and Affect: Mood normal.        Behavior: Behavior normal. Behavior is cooperative.        Thought Content: Thought content normal.        Judgment: Judgment normal.     UC Treatments / Results  Labs (all labs ordered are listed, but only abnormal results are displayed) Labs Reviewed  SARS CORONAVIRUS 2 (TAT 6-24 HRS)    EKG   Radiology No results found.  Procedures Procedures (including critical care time)  Medications Ordered in UC Medications - No data to display  Initial Impression / Assessment and Plan / UC Course  I have reviewed the triage vital signs and the nursing notes.  Pertinent labs & imaging results that were available during my care of the patient were reviewed by me and considered in my medical decision making (see chart for details).     Reviewed with patient that he probably has a viral illness- possible early sinusitis but most likely viral in nature. No antibiotics needed at this time. Patient does not like to take OTC decongestants/cough medications due to side effects. Recommend continue Nasal lavage as needed. Continue to push fluids to help loosen up mucus in sinuses. May return to work tomorrow if [place] 19 test result is negative. Note written for work. Follow-up pending COVID 19 test results.    Final Clinical Impressions(s) / UC Diagnoses   Final diagnoses:  Sinus  pressure  Nasal congestion  Cough     Discharge Instructions      Recommend continue nasal lavage as directed. Continue to push fluids to help loosen up mucus in sinuses and chest. Rest. Stay at home. Follow-up pending COVID 19 test results.      ED Prescriptions   None    PDMP not reviewed this encounter.   Katy Apo, NP 03/09/21 1048

## 2021-03-08 NOTE — ED Triage Notes (Signed)
Pt here with C/O sore throat drainage, fever,for 2 days negative at home test. Fredrich Birks would like a negative test, pt thinks it is a sinus infection.

## 2021-03-08 NOTE — Discharge Instructions (Addendum)
Recommend continue nasal lavage as directed. Continue to push fluids to help loosen up mucus in sinuses and chest. Rest. Stay at home. Follow-up pending COVID 19 test results.

## 2021-04-09 ENCOUNTER — Encounter: Payer: Self-pay | Admitting: General Surgery

## 2021-05-19 ENCOUNTER — Encounter: Payer: Self-pay | Admitting: Physician Assistant

## 2021-05-22 ENCOUNTER — Telehealth: Payer: Self-pay

## 2021-05-22 NOTE — Telephone Encounter (Signed)
CALLED PATIENT NO ANSWER LEFT VOICEMAIL FOR A CALL BACK ? ?

## 2021-05-31 ENCOUNTER — Telehealth: Payer: Self-pay

## 2021-05-31 NOTE — Telephone Encounter (Signed)
Letter sent CALLED PATIENT NO ANSWER LEFT VOICEMAIL FOR A CALL BACK

## 2021-06-01 ENCOUNTER — Telehealth: Payer: Self-pay

## 2021-06-01 NOTE — Telephone Encounter (Signed)
Patient is ready to schedule procedure. Clinical staff will follow up with patient. °

## 2021-06-04 ENCOUNTER — Telehealth: Payer: Self-pay

## 2021-06-04 NOTE — Telephone Encounter (Signed)
Called patient to schedule procedure no answer left voicemail for a call back

## 2021-06-04 NOTE — Telephone Encounter (Deleted)
Cardioligist is damion [employer]

## 2021-06-05 NOTE — Telephone Encounter (Signed)
Pt is returning phone call to schedule procedure

## 2021-06-08 ENCOUNTER — Telehealth: Payer: Self-pay

## 2021-06-08 NOTE — Telephone Encounter (Signed)
CALLED PATIENT NO ANSWER LEFT VOICEMAIL FOR A CALL BACK ? ?

## 2021-06-13 ENCOUNTER — Other Ambulatory Visit: Payer: Self-pay

## 2021-06-13 ENCOUNTER — Telehealth: Payer: Self-pay

## 2021-06-13 DIAGNOSIS — Z1211 Encounter for screening for malignant neoplasm of colon: Secondary | ICD-10-CM

## 2021-06-13 MED ORDER — NA SULFATE-K SULFATE-MG SULF 17.5-3.13-1.6 GM/177ML PO SOLN: 1.0000 | Freq: Once | ORAL | 0 refills | Status: AC

## 2021-06-13 NOTE — Telephone Encounter (Signed)
Gastroenterology Pre-Procedure Review  Request Date: 06/22/21 Requesting Physician: Dr. Allen Norris  PATIENT REVIEW QUESTIONS: The patient responded to the following health history questions as indicated:    1. Are you having any GI issues? no 2. Do you have a personal history of Polyps? no 3. Do you have a family history of Colon Cancer or Polyps? yes (Father, colon cancer mother, rectal tumor) 4. Diabetes Mellitus? no 5. Joint replacements in the past 12 months?no 6. Major health problems in the past 3 months?no 7. Any artificial heart valves, MVP, or defibrillator?no    MEDICATIONS & ALLERGIES:    Patient reports the following regarding taking any anticoagulation/antiplatelet therapy:   Plavix, Coumadin, Eliquis, Xarelto, Lovenox, Pradaxa, Brilinta, or Effient? no Aspirin? no  Patient confirms/reports the following medications:  Current Outpatient Medications  Medication Sig Dispense Refill   B Complex Vitamins (VITAMIN B COMPLEX PO) Take by mouth daily.      Cholecalciferol 25 MCG (1000 UT) capsule Take by mouth daily.      ipratropium (ATROVENT) 0.06 % nasal spray Place 2 sprays into both nostrils 4 (four) times daily. 15 mL 12   magnesium oxide (MAG-OX) 400 MG tablet Take 400 mg by mouth daily.      tadalafil (CIALIS) 10 MG tablet SMARTSIG:1 Tablet(s) By Mouth As Needed     zinc gluconate 50 MG tablet Take by mouth once a week.      No current facility-administered medications for this visit.    Patient confirms/reports the following allergies:  Allergies  Allergen Reactions   Alendronate     Other reaction(s): Other (See Comments) Raw throat, bumps inside mouth.   Other Rash    TAPE   Tape Rash    Paper tape ok    No orders of the defined types were placed in this encounter.   AUTHORIZATION INFORMATION Primary Insurance: 1D#: Group #:  Secondary Insurance: 1D#: Group #:  SCHEDULE INFORMATION: Date:  Time: Location:

## 2021-06-14 ENCOUNTER — Encounter: Payer: Self-pay | Admitting: Gastroenterology

## 2021-06-20 ENCOUNTER — Other Ambulatory Visit: Payer: Self-pay

## 2021-06-20 ENCOUNTER — Ambulatory Visit
Admission: EM | Admit: 2021-06-20 | Discharge: 2021-06-20 | Disposition: A | Discharge status: Home / Self Care | Payer: 59 | Attending: Internal Medicine | Admitting: Internal Medicine

## 2021-06-20 DIAGNOSIS — U071 COVID-19: Secondary | ICD-10-CM

## 2021-06-20 HISTORY — DX: COVID-19: U07.1

## 2021-06-20 LAB — RESP PANEL BY RT-PCR (FLU A&B, COVID) ARPGX2
Influenza A by PCR: NEGATIVE
Influenza B by PCR: NEGATIVE
SARS Coronavirus 2 by RT PCR: POSITIVE — AB

## 2021-06-20 MED ORDER — IBUPROFEN 600 MG PO TABS: 600.0000 mg | ORAL_TABLET | Freq: Four times a day (QID) | ORAL | 0 refills | Status: DC | PRN

## 2021-06-20 MED ORDER — MOLNUPIRAVIR EUA 200MG CAPSULE: 4.0000 | ORAL_CAPSULE | Freq: Two times a day (BID) | ORAL | 0 refills | Status: AC

## 2021-06-20 MED ORDER — BENZONATATE 100 MG PO CAPS: 100.0000 mg | ORAL_CAPSULE | Freq: Three times a day (TID) | ORAL | 0 refills | Status: DC | PRN

## 2021-06-20 NOTE — Discharge Instructions (Addendum)
Your Covid-19 test is positive.   Please follow the current CDC guidance (updated Dec. 27, 2021) below:  Stay at home for 5 days starting on 06/20/2021 If you have no symptoms or your symptoms are improving after 5 days AND you have no fever for 24 hours, you can leave your house Please continue to wear a well-fitting face mask for 5 additional days when you are around other people. You may be able to return to work after day 5 if your symptoms are improving AND you have no fever.

## 2021-06-20 NOTE — ED Triage Notes (Signed)
Pt exposed to [place] in household.  Reports s/s starting this morning of cough, congestion, HA, chills but no fever.  Has been using lavage and OTC supplements.

## 2021-06-20 NOTE — ED Provider Notes (Signed)
[facility]    CSN: [id number] Arrival date & time: 06/20/21  1147      History   Chief Complaint Chief Complaint  Patient presents with   Cough    HPI Roberto Holder is a 60 y.o. male comes to the urgent care with 1 day 3 of nasal congestion cough productive of clear sputum, headaches and chills but no fever.  Patient's symptoms started this morning and has been persistent.  No nausea vomiting or diarrhea.  Patient's wife was diagnosed with COVID-19 infection late last week.  She is currently recovering with antiviral treatment.  Patient denies any shortness of breath, chest tightness or wheezing.  He endorses generalized body aches.  HPI  Past Medical History:  Diagnosis Date   Medical history non-contributory     There are no problems to display for this patient.   Past Surgical History:  Procedure Laterality Date   CHOLECYSTECTOMY     GANGLION CYST EXCISION Right    wrist   KNEE ARTHROTOMY Left 01/27/2018   Procedure: KNEE ARTHROTOMY MEDIAL MENISOUS ROOT REPAIR;  Surgeon: Leim Fabry, MD;  Location: [place];  Service: Orthopedics;  Laterality: Left;       Home Medications    Prior to Admission medications   Medication Sig Start Date End Date Taking? Authorizing Provider  benzonatate (TESSALON) 100 MG capsule Take 1 capsule (100 mg total) by mouth 3 (three) times daily as needed for cough. 06/20/21  Yes Rawson Minix, Myrene Galas, MD  ibuprofen (ADVIL) 600 MG tablet Take 1 tablet (600 mg total) by mouth every 6 (six) hours as needed. 06/20/21  Yes Ayva Veilleux, Myrene Galas, MD  molnupiravir EUA (LAGEVRIO) 200 mg CAPS capsule Take 4 capsules (800 mg total) by mouth 2 (two) times daily for 5 days. 06/20/21 06/25/21 Yes Kerin Cecchi, Myrene Galas, MD  B Complex Vitamins (VITAMIN B COMPLEX PO) Take by mouth daily.     [provider]  Cholecalciferol 25 MCG (1000 UT) capsule Take by mouth daily.     [provider]  magnesium oxide (MAG-OX) 400 MG  tablet Take 400 mg by mouth daily.     [provider]  QUERCETIN PO Take by mouth.    [provider]  tadalafil (CIALIS) 10 MG tablet SMARTSIG:1 Tablet(s) By Mouth As Needed 06/10/19   [provider]    Family History Family History  Problem Relation Age of Onset   Cancer Mother        colon   Hypertension Mother    Heart attack Mother    Heart disease Mother    Stroke Mother    Cancer Father        colon   Heart attack Father 51   Heart disease Father    Heart disease Sister    Heart attack Brother    Heart disease Brother    Heart attack Brother    Heart disease Brother     Social History Social History   Tobacco Use   Smoking status: Never   Smokeless tobacco: Never  Vaping Use   Vaping Use: Never used  Substance Use Topics   Alcohol use: Yes    Alcohol/week: 2.0 standard drinks    Types: 2 Cans of beer per week    Comment: socially   Drug use: No     Allergies   Alendronate, Other, and Tape   Review of Systems Review of Systems  Constitutional:  Positive for chills. Negative for fatigue and fever.  HENT:  Positive for congestion. Negative for rhinorrhea and sore throat.   Respiratory:  Positive for cough and chest tightness. Negative for shortness of breath and wheezing.   Cardiovascular:  Negative for chest pain.  Gastrointestinal: Negative.   Musculoskeletal:  Positive for myalgias.  Neurological:  Positive for headaches.    Physical Exam Triage Vital Signs ED Triage Vitals  Enc Vitals Group     BP 06/20/21 1207 136/90     Pulse Rate 06/20/21 1207 (!) 104     Resp 06/20/21 1207 18     Temp 06/20/21 1207 98.9 F (37.2 C)     Temp Source 06/20/21 1207 Oral     SpO2 06/20/21 1207 98 %     Weight --      Height --      Head Circumference --      Peak Flow --      Pain Score 06/20/21 1210 0     Pain Loc --      Pain Edu? --      Excl. in [place]? --    No data found.  Updated Vital Signs BP 136/90 (BP Location:  Left Arm)    Pulse (!) 104    Temp 98.9 F (37.2 C) (Oral)    Resp 18    SpO2 98%   Visual Acuity Right Eye Distance:   Left Eye Distance:   Bilateral Distance:    Right Eye Near:   Left Eye Near:    Bilateral Near:     Physical Exam Vitals and nursing note reviewed.  Constitutional:      Appearance: Normal appearance. He is ill-appearing.  HENT:     Right Ear: Tympanic membrane normal.     Left Ear: Tympanic membrane normal.  Cardiovascular:     Rate and Rhythm: Normal rate and regular rhythm.     Pulses: Normal pulses.     Heart sounds: Normal heart sounds.  Pulmonary:     Effort: Pulmonary effort is normal.     Breath sounds: Normal breath sounds. No wheezing, rhonchi or rales.  Abdominal:     General: Bowel sounds are normal.  Neurological:     Mental Status: He is alert.     UC Treatments / Results  Labs (all labs ordered are listed, but only abnormal results are displayed) Labs Reviewed  RESP PANEL BY RT-PCR (FLU A&B, COVID) ARPGX2 - Abnormal; Notable for the following components:      Result Value   SARS Coronavirus 2 by RT PCR POSITIVE (*)    All other components within normal limits  COVID-19, FLU A+B NAA    EKG   Radiology No results found.  Procedures Procedures (including critical care time)  Medications Ordered in UC Medications - No data to display  Initial Impression / Assessment and Plan / UC Course  I have reviewed the triage vital signs and the nursing notes.  Pertinent labs & imaging results that were available during my care of the patient were reviewed by me and considered in my medical decision making (see chart for details).     COVID-19 infection: Molnupirivar Tessalon Perles as needed for cough Ibuprofen as needed for body aches Increase fluid intake COVID-19 PCR test is positive Flu A/B are both negative Return to urgent care if you have any further concerns Follow isolation protocol as per discharge instructions. Final  Clinical Impressions(s) / UC Diagnoses   Final diagnoses:  COVID-19 virus infection     Discharge Instructions  Your Covid-19 test is positive.   Please follow the current CDC guidance (updated Dec. 27, 2021) below:  Stay at home for 5 days starting on 06/20/2021 If you have no symptoms or your symptoms are improving after 5 days AND you have no fever for 24 hours, you can leave your house Please continue to wear a well-fitting face mask for 5 additional days when you are around other people. You may be able to return to work after day 5 if your symptoms are improving AND you have no fever.       ED Prescriptions     Medication Sig Dispense Auth. Provider   molnupiravir EUA (LAGEVRIO) 200 mg CAPS capsule Take 4 capsules (800 mg total) by mouth 2 (two) times daily for 5 days. 40 capsule Lyle Niblett, Myrene Galas, MD   benzonatate (TESSALON) 100 MG capsule Take 1 capsule (100 mg total) by mouth 3 (three) times daily as needed for cough. 21 capsule Jamesyn Moorefield, Myrene Galas, MD   ibuprofen (ADVIL) 600 MG tablet Take 1 tablet (600 mg total) by mouth every 6 (six) hours as needed. 30 tablet Annalyn Blecher, Myrene Galas, MD      PDMP not reviewed this encounter.   Chase Picket, MD 06/20/21 1356

## 2021-07-04 ENCOUNTER — Other Ambulatory Visit: Payer: Self-pay

## 2021-07-04 ENCOUNTER — Encounter: Payer: Self-pay | Admitting: Emergency Medicine

## 2021-07-04 ENCOUNTER — Ambulatory Visit
Admission: EM | Admit: 2021-07-04 | Discharge: 2021-07-04 | Disposition: A | Discharge status: Home / Self Care | Payer: 59 | Attending: Emergency Medicine | Admitting: Emergency Medicine

## 2021-07-04 ENCOUNTER — Ambulatory Visit (INDEPENDENT_AMBULATORY_CARE_PROVIDER_SITE_OTHER): Payer: 59

## 2021-07-04 DIAGNOSIS — R079 Chest pain, unspecified: Secondary | ICD-10-CM | POA: Diagnosis not present

## 2021-07-04 DIAGNOSIS — U071 COVID-19: Secondary | ICD-10-CM | POA: Diagnosis not present

## 2021-07-04 DIAGNOSIS — J4520 Mild intermittent asthma, uncomplicated: Secondary | ICD-10-CM | POA: Diagnosis not present

## 2021-07-04 MED ORDER — ALBUTEROL SULFATE HFA 108 (90 BASE) MCG/ACT IN AERS: 2.0000 | INHALATION_SPRAY | RESPIRATORY_TRACT | 0 refills | Status: DC | PRN

## 2021-07-04 MED ORDER — PREDNISONE 20 MG PO TABS
60.0000 mg | ORAL_TABLET | Freq: Every day | ORAL | 0 refills | Status: AC
Start: 2021-07-04 — End: 2021-07-09

## 2021-07-04 MED ORDER — AEROCHAMBER MV MISC: 2 refills | Status: DC

## 2021-07-04 MED ORDER — NA SULFATE-K SULFATE-MG SULF 17.5-3.13-1.6 GM/177ML PO SOLN: 1.0000 | Freq: Once | ORAL | 0 refills | Status: AC

## 2021-07-04 NOTE — ED Provider Notes (Signed)
[facility]    CSN: [id number] Arrival date & time: 07/04/21  0804      History   Chief Complaint Chief Complaint  Patient presents with   Fatigue    HPI Roberto Holder is a 62 y.o. male.   HPI  61 year old male here for evaluation of post COVID symptoms.  Patient reports that he was diagnosed with COVID on 06/20/2021 and he is still suffering from ongoing nausea, headache, feeling like his lungs are hyperinflated, significant fatigue, and weight loss.  He states he is unsure of how much weight he has lost.  In addition, he has had some ongoing nasal congestion with a clear to white nasal discharge that is infrequent.  He is also having loose stools.  He states he is able to keep down fluid and food but his lung expansion and his fatigue are his most concerning symptoms.  He denies vomiting, ear pain, or cough.  Past Medical History:  Diagnosis Date   Medical history non-contributory     There are no problems to display for this patient.   Past Surgical History:  Procedure Laterality Date   CHOLECYSTECTOMY     GANGLION CYST EXCISION Right    wrist   KNEE ARTHROTOMY Left 01/27/2018   Procedure: KNEE ARTHROTOMY MEDIAL MENISOUS ROOT REPAIR;  Surgeon: Leim Fabry, MD;  Location: [facility];  Service: Orthopedics;  Laterality: Left;       Home Medications    Prior to Admission medications   Medication Sig Start Date End Date Taking? Authorizing Provider  albuterol (VENTOLIN HFA) 108 (90 Base) MCG/ACT inhaler Inhale 2 puffs into the lungs every 4 (four) hours as needed. 07/04/21  Yes Margarette Canada, NP  predniSONE (DELTASONE) 20 MG tablet Take 3 tablets (60 mg total) by mouth daily with breakfast for 5 days. 3 tablets daily for 5 days. 07/04/21 07/09/21 Yes Margarette Canada, NP  Spacer/Aero-Holding Chambers (AEROCHAMBER MV) inhaler Use as instructed 07/04/21  Yes Margarette Canada, NP  B Complex Vitamins (VITAMIN B COMPLEX PO) Take by mouth daily.     [provider]  benzonatate (TESSALON) 100 MG capsule Take 1 capsule (100 mg total) by mouth 3 (three) times daily as needed for cough. 06/20/21   Chase Picket, MD  Cholecalciferol 25 MCG (1000 UT) capsule Take by mouth daily.     [provider]  ibuprofen (ADVIL) 600 MG tablet Take 1 tablet (600 mg total) by mouth every 6 (six) hours as needed. 06/20/21   LampteyMyrene Galas, MD  magnesium oxide (MAG-OX) 400 MG tablet Take 400 mg by mouth daily.     [provider]  QUERCETIN PO Take by mouth.    [provider]  tadalafil (CIALIS) 10 MG tablet SMARTSIG:1 Tablet(s) By Mouth As Needed 06/10/19   [provider]    Family History Family History  Problem Relation Age of Onset   Cancer Mother        colon   Hypertension Mother    Heart attack Mother    Heart disease Mother    Stroke Mother    Cancer Father        colon   Heart attack Father 57   Heart disease Father    Heart disease Sister    Heart attack Brother    Heart disease Brother    Heart attack Brother    Heart disease Brother     Social History Social History   Tobacco Use  Smoking status: Never   Smokeless tobacco: Never  Vaping Use   Vaping Use: Never used  Substance Use Topics   Alcohol use: Yes    Alcohol/week: 2.0 standard drinks    Types: 2 Cans of beer per week    Comment: socially   Drug use: No     Allergies   Alendronate, Other, and Tape   Review of Systems Review of Systems  Constitutional:  Positive for fatigue. Negative for activity change, appetite change and fever.  HENT:  Positive for congestion and rhinorrhea. Negative for ear pain and sore throat.   Respiratory:  Positive for chest tightness. Negative for cough, shortness of breath and wheezing.        Patient reports that his lungs feel full.  Occasional cough with deep respiration.  Gastrointestinal:  Positive for nausea. Negative for diarrhea and vomiting.  Skin:  Negative for rash.   Neurological:  Positive for headaches.  Hematological: Negative.   Psychiatric/Behavioral: Negative.      Physical Exam Triage Vital Signs ED Triage Vitals  Enc Vitals Group     BP 07/04/21 0812 (!) 151/83     Pulse Rate 07/04/21 0812 81     Resp 07/04/21 0812 16     Temp 07/04/21 0812 98.2 F (36.8 C)     Temp Source 07/04/21 0812 Oral     SpO2 07/04/21 0812 96 %     Weight --      Height --      Head Circumference --      Peak Flow --      Pain Score 07/04/21 0811 0     Pain Loc --      Pain Edu? --      Excl. in [place]? --    No data found.  Updated Vital Signs BP (!) 151/83    Pulse 81    Temp 98.2 F (36.8 C) (Oral)    Resp 16    Wt 209 lb 4.8 oz (94.9 kg)    SpO2 96%    BMI 30.91 kg/m   Visual Acuity Right Eye Distance:   Left Eye Distance:   Bilateral Distance:    Right Eye Near:   Left Eye Near:    Bilateral Near:     Physical Exam Vitals and nursing note reviewed.  Constitutional:      General: He is not in acute distress.    Appearance: Normal appearance. He is normal weight. He is not ill-appearing.  HENT:     Head: Normocephalic and atraumatic.     Right Ear: Tympanic membrane, ear canal and external ear normal. There is no impacted cerumen.     Left Ear: Tympanic membrane, ear canal and external ear normal. There is no impacted cerumen.     Nose: Congestion present. No rhinorrhea.     Mouth/Throat:     Mouth: Mucous membranes are moist.     Pharynx: Oropharynx is clear. No posterior oropharyngeal erythema.  Cardiovascular:     Rate and Rhythm: Normal rate and regular rhythm.     Pulses: Normal pulses.     Heart sounds: Normal heart sounds. No murmur heard.   No friction rub. No gallop.  Pulmonary:     Effort: Pulmonary effort is normal.     Breath sounds: Normal breath sounds. No wheezing, rhonchi or rales.  Musculoskeletal:     Cervical back: Normal range of motion and neck supple.  Lymphadenopathy:     Cervical: No cervical adenopathy.  Skin:    General: Skin is warm and dry.     Capillary Refill: Capillary refill takes less than 2 seconds.     Findings: No erythema or rash.  Neurological:     General: No focal deficit present.     Mental Status: He is alert and oriented to person, place, and time.  Psychiatric:        Mood and Affect: Mood normal.        Behavior: Behavior normal.        Thought Content: Thought content normal.        Judgment: Judgment normal.     UC Treatments / Results  Labs (all labs ordered are listed, but only abnormal results are displayed) Labs Reviewed - No data to display  EKG   Radiology DG Chest 2 View  Result Date: 07/04/2021 CLINICAL DATA:  Chest pain, COVID positive EXAM: CHEST - 2 VIEW COMPARISON:  07/14/2020 FINDINGS: The heart size and mediastinal contours are within normal limits. Both lungs are clear. The visualized skeletal structures are unremarkable. IMPRESSION: No active cardiopulmonary disease. Electronically Signed   By: Elmer Picker M.D.   On: 07/04/2021 08:51    Procedures Procedures (including critical care time)  Medications Ordered in UC Medications - No data to display  Initial Impression / Assessment and Plan / UC Course  I have reviewed the triage vital signs and the nursing notes.  Pertinent labs & imaging results that were available during my care of the patient were reviewed by me and considered in my medical decision making (see chart for details).  Is a pleasant, nontoxic-appearing 61 year old male here for evaluation of post COVID symptoms consisting of headache, a feeling of his lungs being hyperinflated, nausea, weight loss, and loose stools.  Patient is unsure of how much weight he has lost as he did not weigh himself.  He was not weighed at the time of his diagnosis and he was not weighed today.  We will have staff weigh patient and compared to historical records.  He is also having some nasal congestion with occasional nasal discharge that  is clear to white.  No cough or shortness of breath.  No vomiting, ear pain, or body aches.  His fatigue is his most significant symptom.  On physical exam patient has pearly-gray tympanic membranes bilaterally with normal light reflex and clear external auditory canals.  Nasal mucosa is mildly edematous without significant erythema or discharge.  Oropharyngeal exam is benign.  No cervical lymphadenopathy appreciated exam.  Patient has clear lung sounds and normal chest excursion through his entire respiratory cycle.  I suspect that this is post COVID syndrome with some respiratory manifestations.  Will obtain chest x-ray to look for any acute cardiopulmonary process or hyperinflation of the lungs.  Given the patient feeling like his lungs are full he may have some reactive airway disease status post his COVID infection.  Chest x-ray independently reviewed and evaluated by me.  Impression: Lungs are well pneumatized but there is questionable scarring in the right lung base anteriorly.  No evidence of overt pneumonia present exam.  Radiology overread is pending. Radiology impression is heart size and mediastinal contours are within normal limits.  Both lungs are clear.  The visualized skeletal structures are unremarkable.  No active cardiopulmonary disease.  We will treat patient as if this is a reactive airway disease situation and do a trial of prednisone 60 mg daily for 5 days as a burst dose along with an albuterol  inhaler.  I will also courage patient to follow-up with his primary care provider as if his symptoms continue for longer than 6 weeks he may be a candidate for the post-COVID clinic.  Final Clinical Impressions(s) / UC Diagnoses   Final diagnoses:  Mild intermittent reactive airway disease without complication   Discharge Instructions   None    ED Prescriptions     Medication Sig Dispense Auth. Provider   predniSONE (DELTASONE) 20 MG tablet Take 3 tablets (60 mg total) by mouth daily  with breakfast for 5 days. 3 tablets daily for 5 days. 15 tablet Margarette Canada, NP   albuterol (VENTOLIN HFA) 108 (90 Base) MCG/ACT inhaler Inhale 2 puffs into the lungs every 4 (four) hours as needed. 18 g Margarette Canada, NP   Spacer/Aero-Holding Chambers (AEROCHAMBER MV) inhaler Use as instructed 1 each Margarette Canada, NP      PDMP not reviewed this encounter.   Margarette Canada, NP 07/04/21 [phone]

## 2021-07-04 NOTE — ED Triage Notes (Signed)
PT was COVID positive 12/21. Reports ongoing nausea, headache, "lungs feel expanded", fatigue, and weight loss since diagnosis.

## 2021-07-04 NOTE — Progress Notes (Signed)
Patient came in the office requesting to be rescheduled for procedure due to sickness. [facility] has been notified of change.

## 2021-07-04 NOTE — Discharge Instructions (Addendum)
Use the albuterol inhaler with a spacer, 1 to 2 puffs every 4-6 hours, as needed for filial lung fullness, wheezing, or shortness of breath.  Take the prednisone 60 mg daily with food for the next 5 days.  This will decrease lung inflammation and hopefully help your symptoms.  It should also up with your fatigue.  If your symptoms continue, or they worsen, please return for reevaluation or see your primary care provider.  If your symptoms continue beyond 6 weeks status post your COVID infection you could be transitioning to long COVID and you should talk to your primary care doctor about getting a referral to one of the [facility] long-haul clinics.

## 2021-08-21 ENCOUNTER — Encounter: Payer: Self-pay | Admitting: Gastroenterology

## 2021-08-24 ENCOUNTER — Other Ambulatory Visit: Payer: Self-pay

## 2021-08-24 ENCOUNTER — Ambulatory Visit: Payer: 59 | Admitting: Anesthesiology

## 2021-08-24 ENCOUNTER — Ambulatory Visit
Admission: RE | Disposition: A | Discharge status: Home / Self Care | Payer: Self-pay | Source: Home / Self Care | Attending: Gastroenterology

## 2021-08-24 ENCOUNTER — Encounter: Payer: Self-pay | Admitting: Gastroenterology

## 2021-08-24 ENCOUNTER — Ambulatory Visit
Admission: RE | Admit: 2021-08-24 | Discharge: 2021-08-24 | Disposition: A | Discharge status: Home / Self Care | Payer: 59 | Attending: Gastroenterology | Admitting: Gastroenterology

## 2021-08-24 DIAGNOSIS — D122 Benign neoplasm of ascending colon: Secondary | ICD-10-CM | POA: Insufficient documentation

## 2021-08-24 DIAGNOSIS — K640 First degree hemorrhoids: Secondary | ICD-10-CM | POA: Diagnosis not present

## 2021-08-24 DIAGNOSIS — K635 Polyp of colon: Secondary | ICD-10-CM | POA: Diagnosis not present

## 2021-08-24 DIAGNOSIS — Z1211 Encounter for screening for malignant neoplasm of colon: Secondary | ICD-10-CM

## 2021-08-24 DIAGNOSIS — Z860101 Personal history of adenomatous and serrated colon polyps: Secondary | ICD-10-CM

## 2021-08-24 DIAGNOSIS — D123 Benign neoplasm of transverse colon: Secondary | ICD-10-CM | POA: Insufficient documentation

## 2021-08-24 DIAGNOSIS — D125 Benign neoplasm of sigmoid colon: Secondary | ICD-10-CM | POA: Insufficient documentation

## 2021-08-24 HISTORY — PX: COLONOSCOPY WITH PROPOFOL: SHX5780

## 2021-08-24 HISTORY — PX: POLYPECTOMY: SHX5525

## 2021-08-24 SURGERY — COLONOSCOPY WITH PROPOFOL
Anesthesia: General

## 2021-08-24 MED ORDER — LACTATED RINGERS IV SOLN: INTRAVENOUS | Status: DC

## 2021-08-24 MED ORDER — LIDOCAINE HCL (CARDIAC) PF 100 MG/5ML IV SOSY
PREFILLED_SYRINGE | INTRAVENOUS | Status: DC | PRN
  Administered 2021-08-24: 40 mg via INTRAVENOUS

## 2021-08-24 MED ORDER — STERILE WATER FOR IRRIGATION IR SOLN
Status: DC | PRN
  Administered 2021-08-24: 150 mL

## 2021-08-24 MED ORDER — SODIUM CHLORIDE 0.9 % IV SOLN: INTRAVENOUS | Status: DC

## 2021-08-24 MED ORDER — PROPOFOL 10 MG/ML IV BOLUS
INTRAVENOUS | Status: DC | PRN
  Administered 2021-08-24: 50 mg via INTRAVENOUS
  Administered 2021-08-24: 20 mg via INTRAVENOUS
  Administered 2021-08-24: 50 mg via INTRAVENOUS
  Administered 2021-08-24: 20 mg via INTRAVENOUS
  Administered 2021-08-24: 70 mg via INTRAVENOUS
  Administered 2021-08-24: 50 mg via INTRAVENOUS

## 2021-08-24 SURGICAL SUPPLY — 23 items
CLIP HMST 235XBRD CATH ROT (MISCELLANEOUS) IMPLANT
CLIP RESOLUTION 360 11X235 (MISCELLANEOUS)
ELECT REM PT RETURN 9FT ADLT (ELECTROSURGICAL)
ELECTRODE REM PT RTRN 9FT ADLT (ELECTROSURGICAL) IMPLANT
FORCEPS BIOP RAD 4 LRG CAP 4 (CUTTING FORCEPS) IMPLANT
GOWN CVR UNV OPN BCK APRN NK (MISCELLANEOUS) ×4 IMPLANT
GOWN ISOL THUMB LOOP REG UNIV (MISCELLANEOUS) ×6
INJECTOR VARIJECT VIN23 (MISCELLANEOUS) IMPLANT
KIT DEFENDO VALVE AND CONN (KITS) IMPLANT
KIT PRC NS LF DISP ENDO (KITS) ×2 IMPLANT
KIT PROCEDURE OLYMPUS (KITS) ×3
MANIFOLD NEPTUNE II (INSTRUMENTS) ×3 IMPLANT
MARKER SPOT ENDO TATTOO 5ML (MISCELLANEOUS) IMPLANT
PROBE APC STR FIRE (PROBE) IMPLANT
RETRIEVER NET ROTH 2.5X230 LF (MISCELLANEOUS) IMPLANT
SNARE COLD EXACTO (MISCELLANEOUS) ×1 IMPLANT
SNARE LASSO HEX 3 IN 1 (INSTRUMENTS) ×1 IMPLANT
SNARE SHORT THROW 13M SML OVAL (MISCELLANEOUS) IMPLANT
SNARE SNG USE RND 15MM (INSTRUMENTS) IMPLANT
SPOT EX ENDOSCOPIC TATTOO (MISCELLANEOUS)
TRAP ETRAP POLY (MISCELLANEOUS) ×1 IMPLANT
VARIJECT INJECTOR VIN23 (MISCELLANEOUS)
WATER STERILE IRR 250ML POUR (IV SOLUTION) ×3 IMPLANT

## 2021-08-24 NOTE — H&P (Signed)
Roberto Lame, MD [facility] [address], [place], [place] [address] Phone: [phone] Fax : [phone]  Primary Care Physician:  Martin Majestic, FNP Primary Gastroenterologist:  Dr. Allen Norris  Pre-Procedure History & Physical: HPI:  Roberto Holder is a 61 y.o. male is here for a screening colonoscopy.   Past Medical History:  Diagnosis Date   COVID-19 06/20/2021   Medical history non-contributory     Past Surgical History:  Procedure Laterality Date   CHOLECYSTECTOMY     GANGLION CYST EXCISION Right    wrist   KNEE ARTHROTOMY Left 01/27/2018   Procedure: KNEE ARTHROTOMY MEDIAL MENISOUS ROOT REPAIR;  Surgeon: Leim Fabry, MD;  Location: [facility];  Service: Orthopedics;  Laterality: Left;    Prior to Admission medications   Medication Sig Start Date End Date Taking? Authorizing Provider  B Complex Vitamins (VITAMIN B COMPLEX PO) Take by mouth daily.    Yes [provider]  Cholecalciferol 25 MCG (1000 UT) capsule Take by mouth daily.    Yes [provider]  magnesium oxide (MAG-OX) 400 MG tablet Take 400 mg by mouth daily.    Yes [provider]  QUERCETIN PO Take by mouth.   Yes [provider]  tadalafil (CIALIS) 10 MG tablet SMARTSIG:1 Tablet(s) By Mouth As Needed 06/10/19  Yes [provider]  albuterol (VENTOLIN HFA) 108 (90 Base) MCG/ACT inhaler Inhale 2 puffs into the lungs every 4 (four) hours as needed. Patient not taking: Reported on 08/21/2021 07/04/21   Margarette Canada, NP  benzonatate (TESSALON) 100 MG capsule Take 1 capsule (100 mg total) by mouth 3 (three) times daily as needed for cough. Patient not taking: Reported on 08/21/2021 06/20/21   Chase Picket, MD  ibuprofen (ADVIL) 600 MG tablet Take 1 tablet (600 mg total) by mouth every 6 (six) hours as needed. 06/20/21   Lamptey, Myrene Galas, MD  Spacer/Aero-Holding Chambers (AEROCHAMBER MV) inhaler Use as instructed 07/04/21   Margarette Canada, NP     Allergies as of 06/13/2021 - Review Complete 03/08/2021  Allergen Reaction Noted   Alendronate  07/07/2018   Other Rash 05/01/2018   Tape Rash 07/14/2015    Family History  Problem Relation Age of Onset   Cancer Mother        colon   Hypertension Mother    Heart attack Mother    Heart disease Mother    Stroke Mother    Cancer Father        colon   Heart attack Father 83   Heart disease Father    Heart disease Sister    Heart attack Brother    Heart disease Brother    Heart attack Brother    Heart disease Brother     Social History   Socioeconomic History   Marital status: Single    Spouse name: Not on file   Number of children: Not on file   Years of education: Not on file   Highest education level: Not on file  Occupational History   Not on file  Tobacco Use   Smoking status: Never   Smokeless tobacco: Never  Vaping Use   Vaping Use: Never used  Substance and Sexual Activity   Alcohol use: Yes    Alcohol/week: 2.0 standard drinks    Types: 2 Cans of beer per week    Comment: socially   Drug use: No   Sexual activity: Not on file  Other Topics Concern   Not on  file  Social History Narrative   Not on file   Social Determinants of Health   Financial Resource Strain: Not on file  Food Insecurity: Not on file  Transportation Needs: Not on file  Physical Activity: Not on file  Stress: Not on file  Social Connections: Not on file  Intimate Partner Violence: Not on file    Review of Systems: See HPI, otherwise negative ROS  Physical Exam: BP 120/90    Pulse 82    Temp (!) 97.5 F (36.4 C) (Temporal)    Resp 18    Ht 5\' 9"  (1.753 m)    Wt 92.1 kg    SpO2 97%    BMI 29.98 kg/m  General:   Alert,  pleasant and cooperative in NAD Head:  Normocephalic and atraumatic. Neck:  Supple; no masses or thyromegaly. Lungs:  Clear throughout to auscultation.    Heart:  Regular rate and rhythm. Abdomen:  Soft, nontender and nondistended. Normal bowel sounds,  without guarding, and without rebound.   Neurologic:  Alert and  oriented x4;  grossly normal neurologically.  Impression/Plan: Roberto Holder is now here to undergo a screening colonoscopy.  Risks, benefits, and alternatives regarding colonoscopy have been reviewed with the patient.  Questions have been answered.  All parties agreeable.

## 2021-08-24 NOTE — Op Note (Signed)
[facility] Patient Name: Roberto Holder Procedure Date: 08/24/2021 8:10 AM MRN: [id number] Account #: [id number] Date of Birth: 08/24/1960 Admit Type: Outpatient Age: 61 Room: [facility] OR ROOM 01 Gender: Male Note Status: Finalized Instrument Name: [id number] Procedure:             Colonoscopy Indications:           Screening for colorectal malignant neoplasm Providers:             [name] MD, MD Referring MD:          Garen Lah Medicines:             Propofol per Anesthesia Complications:         No immediate complications. Procedure:             Pre-Anesthesia Assessment:                        - Prior to the procedure, a History and Physical was                         performed, and patient medications and allergies were                         reviewed. The patient's tolerance of previous                         anesthesia was also reviewed. The risks and benefits                         of the procedure and the sedation options and risks                         were discussed with the patient. All questions were                         answered, and informed consent was obtained. Prior                         Anticoagulants: The patient has taken no previous                         anticoagulant or antiplatelet agents. ASA Grade                         Assessment: II - A patient with mild systemic disease.                         After reviewing the risks and benefits, the patient                         was deemed in satisfactory condition to undergo the                         procedure.                        After obtaining informed consent, the colonoscope was  passed under direct vision. Throughout the procedure,                         the patient's blood pressure, pulse, and oxygen                         saturations were monitored continuously. The                         Colonoscope was introduced through the  anus and                         advanced to the the cecum, identified by appendiceal                         orifice and ileocecal valve. The colonoscopy was                         performed without difficulty. The patient tolerated                         the procedure well. The quality of the bowel                         preparation was excellent. Findings:      The perianal and digital rectal examinations were normal.      Three sessile polyps were found in the ascending colon. The polyps were       3 to 4 mm in size. These polyps were removed with a cold snare.       Resection and retrieval were complete.      A 4 mm polyp was found in the transverse colon. The polyp was sessile.       The polyp was removed with a cold snare. Resection and retrieval were       complete.      Two polyps were found in the sigmoid colon. The polyps were 2 to 9 mm in       size. These polyps were removed with a hot snare. Resection and       retrieval were complete.      Non-bleeding internal hemorrhoids were found during retroflexion. The       hemorrhoids were Grade I (internal hemorrhoids that do not prolapse). Impression:            - Three 3 to 4 mm polyps in the ascending colon,                         removed with a cold snare. Resected and retrieved.                        - One 4 mm polyp in the transverse colon, removed with                         a cold snare. Resected and retrieved.                        - Two 2 to 9 mm polyps in the sigmoid colon, removed  with a hot snare. Resected and retrieved.                        - Non-bleeding internal hemorrhoids. Recommendation:        - Discharge patient to home.                        - Resume previous diet.                        - Continue present medications.                        - Await pathology results.                        - If the pathology report reveals adenomatous tissue,                         then  repeat the colonoscopy for surveillance in 3                         years. Procedure Code(s):     --- Professional ---                        [phone], Colonoscopy, flexible; with removal of                         tumor(s), polyp(s), or other lesion(s) by snare                         technique Diagnosis Code(s):     --- Professional ---                        Z12.11, Encounter for screening for malignant neoplasm                         of colon                        K63.5, Polyp of colon CPT copyright 2019 American Medical Association. All rights reserved. The codes documented in this report are preliminary and upon coder review may  be revised to meet current compliance requirements. [name] MD, MD 08/24/2021 8:38:00 AM This report has been signed electronically. Number of Addenda: 0 Note Initiated On: 08/24/2021 8:10 AM Scope Withdrawal Time: 0 hours 14 minutes 8 seconds  Total Procedure Duration: 0 hours 18 minutes 6 seconds  Estimated Blood Loss:  Estimated blood loss: none.      [facility]

## 2021-08-24 NOTE — Transfer of Care (Signed)
Immediate Anesthesia Transfer of Care Note  Patient: Roberto Holder  Procedure(s) Performed: COLONOSCOPY WITH PROPOFOL POLYPECTOMY  Patient Location: PACU  Anesthesia Type: General  Level of Consciousness: awake, alert  and patient cooperative  Airway and Oxygen Therapy: Patient Spontanous Breathing and Patient connected to supplemental oxygen  Post-op Assessment: Post-op Vital signs reviewed, Patient's Cardiovascular Status Stable, Respiratory Function Stable, Patent Airway and No signs of Nausea or vomiting  Post-op Vital Signs: Reviewed and stable  Complications: No notable events documented.

## 2021-08-24 NOTE — Anesthesia Preprocedure Evaluation (Signed)
Anesthesia Evaluation  Patient identified by MRN, date of birth, ID band Patient awake    Reviewed: Allergy & Precautions, H&P , NPO status , Patient's Chart, lab work & pertinent test results  Airway Mallampati: II  TM Distance: >3 FB Neck ROM: full    Dental no notable dental hx.    Pulmonary neg pulmonary ROS,    Pulmonary exam normal        Cardiovascular negative cardio ROS Normal cardiovascular exam Rhythm:regular Rate:Normal     Neuro/Psych negative neurological ROS  negative psych ROS   GI/Hepatic negative GI ROS, Neg liver ROS,   Endo/Other  negative endocrine ROS  Renal/GU negative Renal ROS  negative genitourinary   Musculoskeletal   Abdominal   Peds  Hematology negative hematology ROS (+)   Anesthesia Other Findings   Reproductive/Obstetrics                             Anesthesia Physical Anesthesia Plan  ASA: 1  Anesthesia Plan: General   Post-op Pain Management:    Induction:   PONV Risk Score and Plan: 2 and TIVA, Propofol infusion and Treatment may vary due to age or medical condition  Airway Management Planned:   Additional Equipment:   Intra-op Plan:   Post-operative Plan:   Informed Consent: I have reviewed the patients History and Physical, chart, labs and discussed the procedure including the risks, benefits and alternatives for the proposed anesthesia with the patient or authorized representative who has indicated his/her understanding and acceptance.       Plan Discussed with:   Anesthesia Plan Comments:         Anesthesia Quick Evaluation

## 2021-08-24 NOTE — Anesthesia Postprocedure Evaluation (Signed)
Anesthesia Post Note  Patient: Roberto Holder  Procedure(s) Performed: COLONOSCOPY WITH PROPOFOL POLYPECTOMY     Patient location during evaluation: PACU Anesthesia Type: General Level of consciousness: awake and alert Pain management: pain level controlled Vital Signs Assessment: post-procedure vital signs reviewed and stable Respiratory status: spontaneous breathing Cardiovascular status: stable Anesthetic complications: no   No notable events documented.  Gillian Scarce

## 2021-08-27 ENCOUNTER — Encounter: Payer: Self-pay | Admitting: Gastroenterology

## 2021-08-27 LAB — SURGICAL PATHOLOGY

## 2021-08-28 ENCOUNTER — Encounter: Payer: Self-pay | Admitting: Gastroenterology

## 2021-12-22 ENCOUNTER — Emergency Department: Payer: 59

## 2021-12-22 ENCOUNTER — Emergency Department
Admission: EM | Admit: 2021-12-22 | Discharge: 2021-12-22 | Disposition: A | Discharge status: Home / Self Care | Payer: 59 | Attending: Emergency Medicine | Admitting: Emergency Medicine

## 2021-12-22 ENCOUNTER — Encounter: Payer: Self-pay | Admitting: Emergency Medicine

## 2021-12-22 ENCOUNTER — Other Ambulatory Visit: Payer: Self-pay

## 2021-12-22 DIAGNOSIS — R202 Paresthesia of skin: Secondary | ICD-10-CM | POA: Diagnosis not present

## 2021-12-22 DIAGNOSIS — I480 Paroxysmal atrial fibrillation: Secondary | ICD-10-CM | POA: Diagnosis not present

## 2021-12-22 DIAGNOSIS — R5383 Other fatigue: Secondary | ICD-10-CM | POA: Diagnosis present

## 2021-12-22 DIAGNOSIS — I10 Essential (primary) hypertension: Secondary | ICD-10-CM | POA: Insufficient documentation

## 2021-12-22 DIAGNOSIS — R519 Headache, unspecified: Secondary | ICD-10-CM

## 2021-12-22 LAB — PROTIME-INR
INR: 1 (ref 0.8–1.2)
Prothrombin Time: 12.7 seconds (ref 11.4–15.2)

## 2021-12-22 LAB — CBC WITH DIFFERENTIAL/PLATELET
Abs Immature Granulocytes: 0.02 10*3/uL (ref 0.00–0.07)
Basophils Absolute: 0 10*3/uL (ref 0.0–0.1)
Basophils Relative: 1 %
Eosinophils Absolute: 0.1 10*3/uL (ref 0.0–0.5)
Eosinophils Relative: 2 %
HCT: 48.7 % (ref 39.0–52.0)
Hemoglobin: 16.1 g/dL (ref 13.0–17.0)
Immature Granulocytes: 0 %
Lymphocytes Relative: 29 %
Lymphs Abs: 1.9 10*3/uL (ref 0.7–4.0)
MCH: 28.1 pg (ref 26.0–34.0)
MCHC: 33.1 g/dL (ref 30.0–36.0)
MCV: 85.1 fL (ref 80.0–100.0)
Monocytes Absolute: 0.4 10*3/uL (ref 0.1–1.0)
Monocytes Relative: 6 %
Neutro Abs: 4 10*3/uL (ref 1.7–7.7)
Neutrophils Relative %: 62 %
Platelets: 245 10*3/uL (ref 150–400)
RBC: 5.72 MIL/uL (ref 4.22–5.81)
RDW: 12.4 % (ref 11.5–15.5)
WBC: 6.4 10*3/uL (ref 4.0–10.5)
nRBC: 0 % (ref 0.0–0.2)

## 2021-12-22 LAB — COMPREHENSIVE METABOLIC PANEL
ALT: 26 U/L (ref 0–44)
AST: 21 U/L (ref 15–41)
Albumin: 4.2 g/dL (ref 3.5–5.0)
Alkaline Phosphatase: 57 U/L (ref 38–126)
Anion gap: 6 (ref 5–15)
BUN: 11 mg/dL (ref 8–23)
CO2: 25 mmol/L (ref 22–32)
Calcium: 9.3 mg/dL (ref 8.9–10.3)
Chloride: 108 mmol/L (ref 98–111)
Creatinine, Ser: 0.98 mg/dL (ref 0.61–1.24)
GFR, Estimated: >60 mL/min (ref >60)
Glucose, Bld: 131 mg/dL — ABNORMAL HIGH (ref 70–99)
Potassium: 4 mmol/L (ref 3.5–5.1)
Sodium: 139 mmol/L (ref 135–145)
Total Bilirubin: 1.2 mg/dL (ref 0.3–1.2)
Total Protein: 8.1 g/dL (ref 6.5–8.1)

## 2021-12-22 LAB — TROPONIN I (HIGH SENSITIVITY)
Troponin I (High Sensitivity): 3 ng/L (ref <18)
Troponin I (High Sensitivity): 3 ng/L (ref <18)

## 2021-12-22 LAB — APTT: aPTT: 29 seconds (ref 24–36)

## 2021-12-22 MED ORDER — IOHEXOL 350 MG/ML SOLN
75.0000 mL | Freq: Once | INTRAVENOUS | Status: AC | PRN
  Administered 2021-12-22: 75 mL via INTRAVENOUS

## 2021-12-22 NOTE — ED Notes (Signed)
DC ppw provided. Pt followup and rx information reviewed as needed. Pt provides verbal consent for dc and declines VS at time of dc. Pt ambulatory to lobby alert and oriented x4

## 2022-02-20 IMAGING — CT CT HEART MORP W/ CTA COR W/ SCORE W/ CA W/CM &/OR W/O CM
1 of 14 series · 3 of 20 positions shown, 4 images · non-contrast
Comparison: CT AP 11/18/2013.

Addendum:
CLINICAL DATA: Dyspnea, Fam hx of CAD

EXAM:
Cardiac/Coronary  CTA
TECHNIQUE: The patient was scanned on a Siemens Somatoform go.Top scanner.

[Series 44: ms multiphase cta coronary 0.60 · axial · 0.41mm/px · z∈[-1122,-1058]mm · 3 of 2871 slices shown, 4 images]
[im [id number]  vessel]
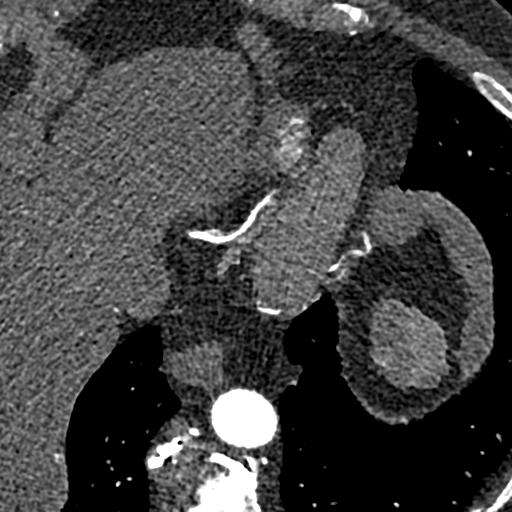
[im [id number]  lung]
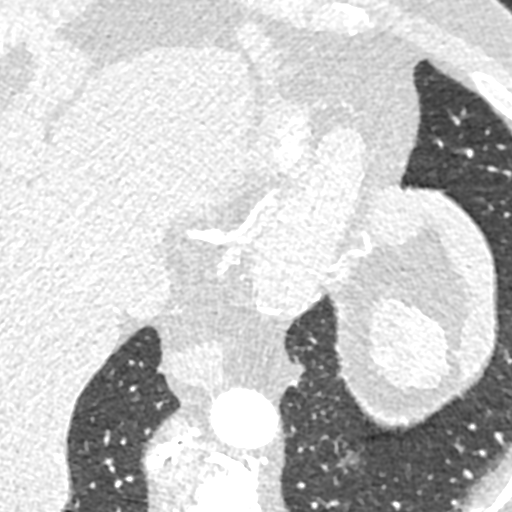
[im [id number]  vessel]
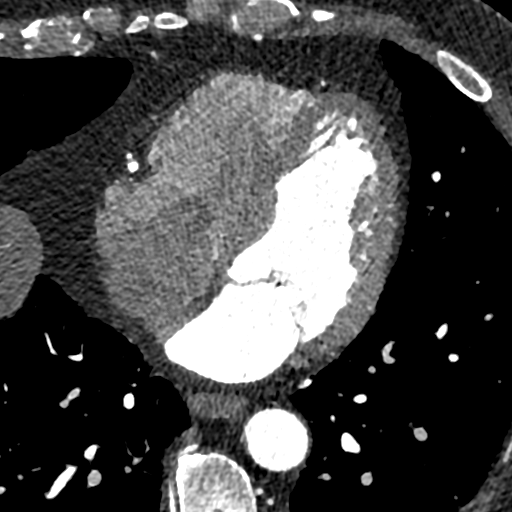
[im [id number]  vessel]
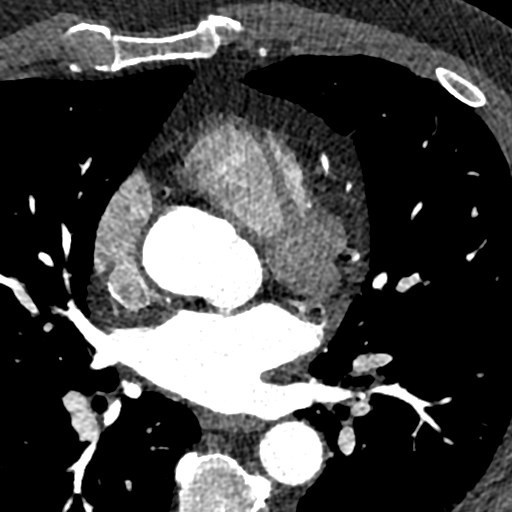

[3 of 20 positions shown; findings below may reference images not displayed]

FINDINGS: A retrospective scan was triggered in the descending thoracic aorta.
Axial non-contrast 3 mm slices were carried out through the heart.
The data set was analyzed on a dedicated work station and scored
using the Agatson method. Gantry rotation speed was 330 msecs and
collimation was .6 mm. 100mg of metoprolol and 0.8 mg of sl NTG was
given. The 3D data set was reconstructed in 5% intervals of the
50-95 % of the R-R cycle. Diastolic phases were analyzed on a
dedicated work station using MPR, MIP and VRT modes. The patient
received 100 cc of contrast.

Aorta:  Normal size.  No calcifications.  No dissection.

Aortic Valve:  Trileaflet.  No calcifications.

Coronary Arteries:  Normal coronary origin.  Right dominance.

RCA is a large dominant artery that gives rise to PDA and PLA. There
is no plaque.

Left main is a large artery that gives rise to LAD and LCX arteries.

LAD is a large vessel that has minimal calcified plaque not causing
any obstruction (0-24%).

LCX is a non-dominant artery that gives rise to one large OM1
branch. There is no plaque.

Other findings:

Normal pulmonary vein drainage into the left atrium.

Normal left atrial appendage without a thrombus.

Normal size of the pulmonary artery.
IMPRESSION: 1. Coronary calcium score of 0.82. This was 33rd percentile for age
and sex matched control.

2. Normal coronary origin with right dominance.

3. Minimal calcification in the proximal LAD.

4. CAD-RADS 1. Minimal non-obstructive CAD (0-24%). Consider
non-atherosclerotic causes of chest pain. Consider preventive
therapy and risk factor modification.

EXAM:
OVER-READ INTERPRETATION  CT CHEST

The following report is an over-read performed by radiologist Dr.
over-read does not include interpretation of cardiac or coronary
anatomy or pathology. The coronary CTA/coronary calcium score
interpretation by the cardiologist is attached.
FINDINGS: Vascular: Normal heart size.  No pericardial effusion.

Mediastinum/nodes: Small hiatal hernia noted. No mass or adenopathy.

Lungs/pleura: No pleural effusion, airspace consolidation or
atelectasis. 4 mm right middle lobe lung nodule is identified, image
23/21. This is unchanged from 11/18/2013 compatible with a benign
abnormality.

Upper abdomen: No acute abnormality within the imaged portions of
the upper abdomen.

Musculoskeletal: No acute or suspicious osseous finding. Mild
multilevel thoracic spondylosis identified.
IMPRESSION: 1. 4 mm right middle lobe lung nodule is noted, unchanged from 0360
compatible with a benign abnormality.
2. Small hiatal hernia.

*** End of Addendum ***
FINDINGS: A retrospective scan was triggered in the descending thoracic aorta.
Axial non-contrast 3 mm slices were carried out through the heart.
The data set was analyzed on a dedicated work station and scored
using the Agatson method. Gantry rotation speed was 330 msecs and
collimation was .6 mm. 100mg of metoprolol and 0.8 mg of sl NTG was
given. The 3D data set was reconstructed in 5% intervals of the
50-95 % of the R-R cycle. Diastolic phases were analyzed on a
dedicated work station using MPR, MIP and VRT modes. The patient
received 100 cc of contrast.

Aorta:  Normal size.  No calcifications.  No dissection.

Aortic Valve:  Trileaflet.  No calcifications.

Coronary Arteries:  Normal coronary origin.  Right dominance.

RCA is a large dominant artery that gives rise to PDA and PLA. There
is no plaque.

Left main is a large artery that gives rise to LAD and LCX arteries.

LAD is a large vessel that has minimal calcified plaque not causing
any obstruction (0-24%).

LCX is a non-dominant artery that gives rise to one large OM1
branch. There is no plaque.

Other findings:

Normal pulmonary vein drainage into the left atrium.

Normal left atrial appendage without a thrombus.

Normal size of the pulmonary artery.
IMPRESSION: 1. Coronary calcium score of 0.82. This was 33rd percentile for age
and sex matched control.

2. Normal coronary origin with right dominance.

3. Minimal calcification in the proximal LAD.

4. CAD-RADS 1. Minimal non-obstructive CAD (0-24%). Consider
non-atherosclerotic causes of chest pain. Consider preventive
therapy and risk factor modification.

## 2022-03-14 ENCOUNTER — Telehealth: Payer: Self-pay | Admitting: Emergency Medicine

## 2022-03-14 ENCOUNTER — Ambulatory Visit
Admission: EM | Admit: 2022-03-14 | Discharge: 2022-03-14 | Disposition: A | Discharge status: Home / Self Care | Payer: 59 | Attending: Emergency Medicine | Admitting: Emergency Medicine

## 2022-03-14 DIAGNOSIS — Z20822 Contact with and (suspected) exposure to covid-19: Secondary | ICD-10-CM | POA: Diagnosis not present

## 2022-03-14 DIAGNOSIS — J069 Acute upper respiratory infection, unspecified: Secondary | ICD-10-CM | POA: Insufficient documentation

## 2022-03-14 LAB — SARS CORONAVIRUS 2 BY RT PCR: SARS Coronavirus 2 by RT PCR: NEGATIVE

## 2022-03-14 MED ORDER — IPRATROPIUM BROMIDE 0.06 % NA SOLN: 2.0000 | Freq: Four times a day (QID) | NASAL | 12 refills | Status: DC

## 2022-03-14 MED ORDER — BENZONATATE 100 MG PO CAPS: 200.0000 mg | ORAL_CAPSULE | Freq: Three times a day (TID) | ORAL | 0 refills | Status: DC

## 2022-03-14 MED ORDER — AMOXICILLIN-POT CLAVULANATE 875-125 MG PO TABS: 1.0000 | ORAL_TABLET | Freq: Two times a day (BID) | ORAL | 0 refills | Status: AC

## 2022-03-14 MED ORDER — PROMETHAZINE-DM 6.25-15 MG/5ML PO SYRP: 5.0000 mL | ORAL_SOLUTION | Freq: Four times a day (QID) | ORAL | 0 refills | Status: DC | PRN

## 2022-03-14 NOTE — ED Provider Notes (Signed)
[facility]    CSN: [id number] Arrival date & time: 03/14/22  0801      History   Chief Complaint Chief Complaint  Patient presents with   Diarrhea   Fever   Nasal Congestion    HPI ZAYVON ALICEA is a 61 y.o. male.   HPI  61 year old male here for evaluation of viral symptoms.  Patient reports that his symptoms began 11 days ago with diarrhea which has since resolved.  He continues to have a mild headache, nasal discharge and postnasal drip that he describes first as being thick and states that it has cleared up over the last couple days.  He had a fever 1 day that lasted a couple of hours and reached a maximum of 99.  He has a nonproductive cough, shortness of breath, and wheezing at night.  He reports that his workplace will not let him come back until he has been evaluated.  Past Medical History:  Diagnosis Date   COVID-19 06/20/2021   Medical history non-contributory     Patient Active Problem List   Diagnosis Date Noted   Special screening for malignant neoplasms, colon    Polyp of sigmoid colon     Past Surgical History:  Procedure Laterality Date   CHOLECYSTECTOMY     COLONOSCOPY WITH PROPOFOL N/A 08/24/2021   Procedure: COLONOSCOPY WITH PROPOFOL;  Surgeon: [name], MD;  Location: [place];  Service: Endoscopy;  Laterality: N/A;   GANGLION CYST EXCISION Right    wrist   KNEE ARTHROTOMY Left 01/27/2018   Procedure: KNEE ARTHROTOMY MEDIAL MENISOUS ROOT REPAIR;  Surgeon: Leim Fabry, MD;  Location: [place];  Service: Orthopedics;  Laterality: Left;   POLYPECTOMY  08/24/2021   Procedure: POLYPECTOMY;  Surgeon: [name], MD;  Location: [place];  Service: Endoscopy;;       Home Medications    Prior to Admission medications   Medication Sig Start Date End Date Taking? Authorizing Provider  albuterol (VENTOLIN HFA) 108 (90 Base) MCG/ACT inhaler Inhale 2 puffs into the lungs every 4 (four) hours as  needed. Patient not taking: Reported on 08/21/2021 07/04/21   Margarette Canada, NP  B Complex Vitamins (VITAMIN B COMPLEX PO) Take by mouth daily.     [provider]  Cholecalciferol 25 MCG (1000 UT) capsule Take by mouth daily.     [provider]  ibuprofen (ADVIL) 600 MG tablet Take 1 tablet (600 mg total) by mouth every 6 (six) hours as needed. 06/20/21   LampteyMyrene Galas, MD  magnesium oxide (MAG-OX) 400 MG tablet Take 400 mg by mouth daily.     [provider]  QUERCETIN PO Take by mouth.    [provider]  Spacer/Aero-Holding Chambers (AEROCHAMBER MV) inhaler Use as instructed 07/04/21   Margarette Canada, NP  tadalafil (CIALIS) 10 MG tablet SMARTSIG:1 Tablet(s) By Mouth As Needed 06/10/19   [provider]    Family History Family History  Problem Relation Age of Onset   Cancer Mother        colon   Hypertension Mother    Heart attack Mother    Heart disease Mother    Stroke Mother    Cancer Father        colon   Heart attack Father 21   Heart disease Father    Heart disease Sister    Heart attack Brother    Heart disease Brother    Heart attack Brother  Heart disease Brother     Social History Social History   Tobacco Use   Smoking status: Never   Smokeless tobacco: Never  Vaping Use   Vaping Use: Never used  Substance Use Topics   Alcohol use: Yes    Alcohol/week: 2.0 standard drinks of alcohol    Types: 2 Cans of beer per week    Comment: socially   Drug use: No     Allergies   Alendronate, Other, and Tape   Review of Systems Review of Systems  Constitutional:  Positive for fever.  HENT:  Positive for congestion, postnasal drip and rhinorrhea. Negative for ear pain.   Respiratory:  Positive for cough, shortness of breath and wheezing.   Gastrointestinal:  Positive for diarrhea. Negative for nausea and vomiting.  Skin:  Negative for rash.  Neurological:  Positive for headaches.  Hematological: Negative.    Psychiatric/Behavioral: Negative.       Physical Exam Triage Vital Signs ED Triage Vitals  Enc Vitals Group     BP 03/14/22 0809 (!) 143/94     Pulse Rate 03/14/22 0809 78     Resp --      Temp 03/14/22 0809 98.5 F (36.9 C)     Temp Source 03/14/22 0809 Oral     SpO2 03/14/22 0809 95 %     Weight 03/14/22 0807 184 lb (83.5 kg)     Height 03/14/22 0807 5' 8.5" (1.74 m)     Head Circumference --      Peak Flow --      Pain Score 03/14/22 0807 0     Pain Loc --      Pain Edu? --      Excl. in [place]? --    No data found.  Updated Vital Signs BP (!) 143/94 (BP Location: Left Arm)   Pulse 78   Temp 98.5 F (36.9 C) (Oral)   Ht 5' 8.5" (1.74 m)   Wt 184 lb (83.5 kg)   SpO2 95%   BMI 27.57 kg/m   Visual Acuity Right Eye Distance:   Left Eye Distance:   Bilateral Distance:    Right Eye Near:   Left Eye Near:    Bilateral Near:     Physical Exam Vitals and nursing note reviewed.  Constitutional:      Appearance: Normal appearance. He is not ill-appearing.  HENT:     Head: Normocephalic and atraumatic.     Right Ear: Tympanic membrane, ear canal and external ear normal. There is no impacted cerumen.     Left Ear: Tympanic membrane, ear canal and external ear normal. There is no impacted cerumen.     Nose: Congestion and rhinorrhea present.     Mouth/Throat:     Mouth: Mucous membranes are moist.     Pharynx: Oropharynx is clear. Posterior oropharyngeal erythema present.  Cardiovascular:     Rate and Rhythm: Normal rate and regular rhythm.     Pulses: Normal pulses.     Heart sounds: Normal heart sounds. No murmur heard.    No friction rub. No gallop.  Pulmonary:     Effort: Pulmonary effort is normal.     Breath sounds: Normal breath sounds. No wheezing, rhonchi or rales.  Musculoskeletal:     Cervical back: Normal range of motion and neck supple.  Lymphadenopathy:     Cervical: No cervical adenopathy.  Skin:    General: Skin is warm and dry.     Capillary  Refill: Capillary  refill takes less than 2 seconds.     Findings: No erythema or rash.  Neurological:     General: No focal deficit present.     Mental Status: He is alert and oriented to person, place, and time.  Psychiatric:        Mood and Affect: Mood normal.        Behavior: Behavior normal.        Thought Content: Thought content normal.        Judgment: Judgment normal.      UC Treatments / Results  Labs (all labs ordered are listed, but only abnormal results are displayed) Labs Reviewed  SARS CORONAVIRUS 2 BY RT PCR    EKG   Radiology No results found.  Procedures Procedures (including critical care time)  Medications Ordered in UC Medications - No data to display  Initial Impression / Assessment and Plan / UC Course  I have reviewed the triage vital signs and the nursing notes.  Pertinent labs & imaging results that were available during my care of the patient were reviewed by me and considered in my medical decision making (see chart for details).   Patient is a nontoxic-appearing 61 year old male here for evaluation of a cluster of symptoms of both respiratory and GI in origin.  The GI symptoms started 11 days ago and consisted of diarrhea and that has since resolved.  His headache, nasal discharge, postnasal drip have all been present the entire time and were initially thick and have now cleared up per patient report.  He had a fever for couple of hours 2 days ago that he rates as 99.  He does have a nonproductive cough and endorses shortness breath and wheezing at nighttime.  On exam patient has pearly-gray tympanic membranes bilaterally with normal light reflex and clear external auditory canals.  His nasal mucosa is erythematous and edematous and there is clear discharge in both nares.  Oropharyngeal exam reveals posterior oropharyngeal erythema and injection with lymphoid follicles.  No anterior cervical of adenopathy on exam.  Cardiopulmonary exam reveals S1-S2  heart sounds with regular rate and rhythm and lung sounds that are clear to auscultation all fields.  Patient is requesting a COVID test as his job is requesting it.  I explained to the patient that he may be positive for COVID as you can remain positive for up to 90 days after your initial infection but since his initial symptoms started 11 days ago he is outside the window for quarantine qualification or antiviral therapy.  He does have clear signs of an upper respiratory infection and I believe the postnasal drip is what is driving the patient's cough.  He has no wheezing on exam and he has been prescribed albuterol inhaler.  I will encourage him to use that as needed.  Given the duration of his symptoms I am inclined to do a trial of antibiotics as well as symptoms management with Atrovent nasal spray, Tessalon Perles, and Promethazine DM cough syrup.  COVID PCR is negative.  All discharge patient home with a diagnosis of upper respiratory infection and treat him with a short course of Augmentin as well as Atrovent nasal spray, Tessalon Perles, and Promethazine DM cough syrup.   Final Clinical Impressions(s) / UC Diagnoses   Final diagnoses:  Upper respiratory tract infection, unspecified type     Discharge Instructions      Your COVID test is negative but you do show signs of an upper respiratory infection.  I  believe that your postnasal drip is what is driving her cough.  Given that her symptoms have been going on for more than 10 days we will do a trial of antibiotics.  The Augmentin twice daily with food for 10 days for treatment of your URI.  Perform sinus irrigation 2-3 times a day with a NeilMed sinus rinse kit and distilled water.  Do not use tap water.  Use the Atrovent nasal spray, 2 squirts up each nostril every 6 hours, as needed for nasal congestion and postnasal drip.  Increase your oral fluid intake to thin out your mucus so that is also able for your body to clear more  easily.  Take an over-the-counter probiotic, such as Culturelle-align-activia, 1 hour after each dose of antibiotic to prevent diarrhea.  Using [employer] every 8 hours during the day as needed for cough.  Take them with a small sip of water.  They may give you some numbness to the base of your tongue or metallic taste in her mouth, this is normal.  Use the Promethazine DM cough syrup at bedtime as needed for cough and congestion.  The promethazine will make you drowsy so do not use it during the day.  If you develop any new or worsening symptoms return for reevaluation or see your primary care provider.      ED Prescriptions   None    PDMP not reviewed this encounter.   Margarette Canada, NP 03/14/22 [phone]

## 2022-03-14 NOTE — ED Triage Notes (Signed)
Patient presents to [facility] for Diarrhea last Sunday. Fever started Tuesday and congestion since then.   Patient reports that he has been taking OTC medication but it has not helped.

## 2022-03-14 NOTE — Discharge Instructions (Addendum)
Your COVID test is negative but you do show signs of an upper respiratory infection.  I believe that your postnasal drip is what is driving her cough.  Given that her symptoms have been going on for more than 10 days we will do a trial of antibiotics.  The Augmentin twice daily with food for 10 days for treatment of your URI.  Perform sinus irrigation 2-3 times a day with a [employer] sinus rinse kit and distilled water.  Do not use tap water.  Use the Atrovent nasal spray, 2 squirts up each nostril every 6 hours, as needed for nasal congestion and postnasal drip.  Increase your oral fluid intake to thin out your mucus so that is also able for your body to clear more easily.  Take an over-the-counter probiotic, such as Culturelle-align-activia, 1 hour after each dose of antibiotic to prevent diarrhea.  Using [employer] every 8 hours during the day as needed for cough.  Take them with a small sip of water.  They may give you some numbness to the base of your tongue or metallic taste in her mouth, this is normal.  Use the Promethazine DM cough syrup at bedtime as needed for cough and congestion.  The promethazine will make you drowsy so do not use it during the day.  If you develop any new or worsening symptoms return for reevaluation or see your primary care provider.

## 2022-03-14 NOTE — Telephone Encounter (Signed)
Patient was evaluated this morning but the prescriptions were never sent to the pharmacy.  Prescriptions have not been sent.

## 2022-03-25 ENCOUNTER — Ambulatory Visit: Payer: 59 | Admitting: Dermatology

## 2022-03-25 DIAGNOSIS — Z1283 Encounter for screening for malignant neoplasm of skin: Secondary | ICD-10-CM | POA: Diagnosis not present

## 2022-03-25 DIAGNOSIS — L719 Rosacea, unspecified: Secondary | ICD-10-CM

## 2022-03-25 DIAGNOSIS — L820 Inflamed seborrheic keratosis: Secondary | ICD-10-CM

## 2022-03-25 DIAGNOSIS — L578 Other skin changes due to chronic exposure to nonionizing radiation: Secondary | ICD-10-CM | POA: Diagnosis not present

## 2022-03-25 DIAGNOSIS — D229 Melanocytic nevi, unspecified: Secondary | ICD-10-CM

## 2022-03-25 DIAGNOSIS — L570 Actinic keratosis: Secondary | ICD-10-CM | POA: Diagnosis not present

## 2022-03-25 DIAGNOSIS — D1801 Hemangioma of skin and subcutaneous tissue: Secondary | ICD-10-CM

## 2022-03-25 DIAGNOSIS — L814 Other melanin hyperpigmentation: Secondary | ICD-10-CM

## 2022-03-25 DIAGNOSIS — L821 Other seborrheic keratosis: Secondary | ICD-10-CM

## 2022-03-25 DIAGNOSIS — D485 Neoplasm of uncertain behavior of skin: Secondary | ICD-10-CM

## 2022-03-25 MED ORDER — TRETINOIN 0.025 % EX GEL: Freq: Every day | CUTANEOUS | 6 refills | Status: DC

## 2022-03-25 NOTE — Progress Notes (Unsigned)
New Patient Visit  Subjective  Roberto Holder is a 61 y.o. male who presents for the following: Other (New patient - no history of skin cancer. The patient presents for Total-Body Skin Exam (TBSE) for skin cancer screening and mole check.  The patient has spots, moles and lesions to be evaluated, some may be new or changing and the patient has concerns that these could be cancer./).  The following portions of the chart were reviewed this encounter and updated as appropriate:   Tobacco  Allergies  Meds  Problems  Med Hx  Surg Hx  Fam Hx     Review of Systems:  No other skin or systemic complaints except as noted in HPI or Assessment and Plan.  Objective  Well appearing patient in no apparent distress; mood and affect are within normal limits.  A full examination was performed including scalp, head, eyes, ears, nose, lips, neck, chest, axillae, abdomen, back, buttocks, bilateral upper extremities, bilateral lower extremities, hands, feet, fingers, toes, fingernails, and toenails. All findings within normal limits unless otherwise noted below.  Face Pinkness of cheeks  Left Upper Back Crusted papule  Arms, hands, right infraocular (20) Erythematous stuck-on, waxy papule or plaque  Face Actinic changes   Assessment & Plan   Lentigines - Scattered tan macules - Due to sun exposure - Benign-appearing, observe - Recommend daily broad spectrum sunscreen SPF 30+ to sun-exposed areas, reapply every 2 hours as needed. - Call for any changes  Seborrheic Keratoses - Stuck-on, waxy, tan-brown papules and/or plaques  - Benign-appearing - Discussed benign etiology and prognosis. - Observe - Call for any changes  Melanocytic Nevi - Tan-brown and/or pink-flesh-colored symmetric macules and papules - Benign appearing on exam today - Observation - Call clinic for new or changing moles - Recommend daily use of broad spectrum spf 30+ sunscreen to sun-exposed areas.    Hemangiomas - Red papules - Discussed benign nature - Observe - Call for any changes  Actinic Damage - Chronic condition, secondary to cumulative UV/sun exposure - diffuse scaly erythematous macules with underlying dyspigmentation - Recommend daily broad spectrum sunscreen SPF 30+ to sun-exposed areas, reapply every 2 hours as needed.  - Staying in the shade or wearing long sleeves, sun glasses (UVA+UVB protection) and wide brim hats (4-inch brim around the entire circumference of the hat) are also recommended for sun protection.  - Call for new or changing lesions.  Skin cancer screening performed today.  Rosacea Face Rosacea is a chronic progressive skin condition usually affecting the face of adults, causing redness and/or acne bumps. It is treatable but not curable. It sometimes affects the eyes (ocular rosacea) as well. It may respond to topical and/or systemic medication and can flare with stress, sun exposure, alcohol, exercise, topical steroids (including hydrocortisone/cortisone 10) and some foods.  Daily application of broad spectrum spf 30+ sunscreen to face is recommended to reduce flares.  Mild - Discussed the treatment option of BBL/laser.  Typically we recommend 1-3 treatment sessions about 5-8 weeks apart for best results.  The patient's condition may require "maintenance treatments" in the future.  The fee for BBL / laser treatments is $350 per treatment session for the whole face.  A fee can be quoted for other parts of the body. Insurance typically does not pay for BBL/laser treatments and therefore the fee is an out-of-pocket cost.  Neoplasm of uncertain behavior of skin 1.2 cm Left Upper Back Epidermal / dermal shaving  Informed consent: discussed and consent obtained  Timeout: patient name, date of birth, surgical site, and procedure verified   Procedure prep:  Patient was prepped and draped in usual sterile fashion Prep type:  Isopropyl alcohol Anesthesia:  the lesion was anesthetized in a standard fashion   Anesthetic:  1% lidocaine w/ epinephrine 1-100,000 buffered w/ 8.4% NaHCO3 Instrument used: flexible razor blade   Hemostasis achieved with: pressure, aluminum chloride and electrodesiccation   Outcome: patient tolerated procedure well   Post-procedure details: sterile dressing applied and wound care instructions given   Dressing type: bandage and petrolatum    Destruction of lesion Complexity: extensive   Destruction method: electrodesiccation and curettage   Informed consent: discussed and consent obtained   Timeout:  patient name, date of birth, surgical site, and procedure verified Procedure prep:  Patient was prepped and draped in usual sterile fashion Prep type:  Isopropyl alcohol Anesthesia: the lesion was anesthetized in a standard fashion   Anesthetic:  1% lidocaine w/ epinephrine 1-100,000 buffered w/ 8.4% NaHCO3 Curettage performed in three different directions: Yes   Electrodesiccation performed over the curetted area: Yes   Lesion length (cm):  1.2 Lesion width (cm):  1.2 Margin per side (cm):  0.2 Final wound size (cm):  1.6 Hemostasis achieved with:  pressure and aluminum chloride Outcome: patient tolerated procedure well with no complications   Post-procedure details: sterile dressing applied and wound care instructions given   Dressing type: bandage and petrolatum    Specimen 1 - Surgical pathology Differential Diagnosis: BCC vs SCC vs other Check Margins: No  Inflamed seborrheic keratosis (20) Arms, hands, right infraocular Destruction of lesion - Arms, hands, right infraocular Complexity: simple   Destruction method: cryotherapy   Informed consent: discussed and consent obtained   Timeout:  patient name, date of birth, surgical site, and procedure verified Lesion destroyed using liquid nitrogen: Yes   Region frozen until ice ball extended beyond lesion: Yes   Outcome: patient tolerated procedure well with  no complications   Post-procedure details: wound care instructions given    Actinic skin damage Face Tretinoin 0.025% qhs   Topical retinoid medications like tretinoin/Retin-A, adapalene/Differin, tazarotene/Fabior, and Epiduo/Epiduo Forte can cause dryness and irritation when first started. Only apply a pea-sized amount to the entire affected area. Avoid applying it around the eyes, edges of mouth and creases at the nose. If you experience irritation, use a good moisturizer first and/or apply the medicine less often. If you are doing well with the medicine, you can increase how often you use it until you are applying every night. Be careful with sun protection while using this medication as it can make you sensitive to the sun. This medicine should not be used by pregnant women.    tretinoin (RETIN-A) 0.025 % gel - Face Apply topically at bedtime.  Return in about 1 year (around 03/26/2023) for TBSE.  I, Ashok Cordia, CMA, am acting as scribe for Sarina Ser, MD . Documentation: I have reviewed the above documentation for accuracy and completeness, and I agree with the above.  Sarina Ser, MD

## 2022-03-25 NOTE — Patient Instructions (Addendum)
Topical retinoid medications like tretinoin/Retin-A, adapalene/Differin, tazarotene/Fabior, and Epiduo/Epiduo Forte can cause dryness and irritation when first started. Only apply a pea-sized amount to the entire affected area. Avoid applying it around the eyes, edges of mouth and creases at the nose. If you experience irritation, use a good moisturizer first and/or apply the medicine less often. If you are doing well with the medicine, you can increase how often you use it until you are applying every night. Be careful with sun protection while using this medication as it can make you sensitive to the sun. This medicine should not be used by pregnant women.     Cryotherapy Aftercare  Wash gently with soap and water everyday.   Apply Vaseline and Band-Aid daily until healed.    Wound Care Instructions  Cleanse wound gently with soap and water once a day then pat dry with clean gauze. Apply a thin coat of Petrolatum (petroleum jelly, "Vaseline") over the wound (unless you have an allergy to this). We recommend that you use a new, sterile tube of Vaseline. Do not pick or remove scabs. Do not remove the yellow or white "healing tissue" from the base of the wound.  Cover the wound with fresh, clean, nonstick gauze and secure with paper tape. You may use Band-Aids in place of gauze and tape if the wound is small enough, but would recommend trimming much of the tape off as there is often too much. Sometimes Band-Aids can irritate the skin.  You should call the office for your biopsy report after 1 week if you have not already been contacted.  If you experience any problems, such as abnormal amounts of bleeding, swelling, significant bruising, significant pain, or evidence of infection, please call the office immediately.  FOR ADULT SURGERY PATIENTS: If you need something for pain relief you may take 1 extra strength Tylenol (acetaminophen) AND 2 Ibuprofen ('200mg'$  each) together every 4 hours as needed  for pain. (do not take these if you are allergic to them or if you have a reason you should not take them.) Typically, you may only need pain medication for 1 to 3 days.     Due to recent changes in healthcare laws, you may see results of your pathology and/or laboratory studies on MyChart before the doctors have had a chance to review them. We understand that in some cases there may be results that are confusing or concerning to you. Please understand that not all results are received at the same time and often the doctors may need to interpret multiple results in order to provide you with the best plan of care or course of treatment. Therefore, we ask that you please give [place] 2 business days to thoroughly review all your results before contacting the office for clarification. Should we see a critical lab result, you will be contacted sooner.   If You Need Anything After Your Visit  If you have any questions or concerns for your doctor, please call our main line at [phone] and press option 4 to reach your doctor's medical assistant. If no one answers, please leave a voicemail as directed and we will return your call as soon as possible. Messages left after 4 pm will be answered the following business day.   You may also send [place] a message via [place]. We typically respond to MyChart messages within 1-2 business days.  For prescription refills, please ask your pharmacy to contact our office. Our fax number is [phone].  If you have  an urgent issue when the clinic is closed that cannot wait until the next business day, you can page your doctor at the number below.    Please note that while we do our best to be available for urgent issues outside of office hours, we are not available 24/7.   If you have an urgent issue and are unable to reach [place], you may choose to seek medical care at your doctor's office, retail clinic, urgent care center, or emergency room.  If you have a medical  emergency, please immediately call 911 or go to the emergency department.  Pager Numbers  - Dr. Nehemiah Massed: [phone]  - Dr. Laurence Ferrari: [phone]  - Dr. Nicole Kindred: [phone]  In the event of inclement weather, please call our main line at [phone] for an update on the status of any delays or closures.  Dermatology Medication Tips: Please keep the boxes that topical medications come in in order to help keep track of the instructions about where and how to use these. Pharmacies typically print the medication instructions only on the boxes and not directly on the medication tubes.   If your medication is too expensive, please contact our office at [phone] option 4 or send [place] a message through [place].   We are unable to tell what your co-pay for medications will be in advance as this is different depending on your insurance coverage. However, we may be able to find a substitute medication at lower cost or fill out paperwork to get insurance to cover a needed medication.   If a prior authorization is required to get your medication covered by your insurance company, please allow [place] 1-2 business days to complete this process.  Drug prices often vary depending on where the prescription is filled and some pharmacies may offer cheaper prices.  The website www.goodrx.com contains coupons for medications through different pharmacies. The prices here do not account for what the cost may be with help from insurance (it may be cheaper with your insurance), but the website can give you the price if you did not use any insurance.  - You can print the associated coupon and take it with your prescription to the pharmacy.  - You may also stop by our office during regular business hours and pick up a GoodRx coupon card.  - If you need your prescription sent electronically to a different pharmacy, notify our office through [facility] or by phone at [phone] option 4.     Si Usted  Necesita Algo Despus de Su Visita  Tambin puede enviarnos un mensaje a travs de Pharmacist, community. Por lo general respondemos a los mensajes de MyChart en el transcurso de 1 a 2 das hbiles.  Para renovar recetas, por favor pida a su farmacia que se ponga en contacto con nuestra oficina. Harland Dingwall de fax es [place] [phone].  Si tiene un asunto urgente cuando la clnica est cerrada y que no puede esperar hasta el siguiente da hbil, puede llamar/localizar a su doctor(a) al nmero que aparece a continuacin.   Por favor, tenga en cuenta que aunque hacemos todo lo posible para estar disponibles para asuntos urgentes fuera del horario de [place], no estamos disponibles las 24 horas del da, los 7 das de la [place].   Si tiene un problema urgente y no puede comunicarse con nosotros, puede optar por buscar atencin mdica  en el consultorio de su doctor(a), en una clnica privada, en un centro de atencin urgente o en una sala de  emergencias.  Si tiene Engineering geologist, por favor llame inmediatamente al 911 o vaya a la sala de emergencias.  Nmeros de bper  - Dr. Nehemiah Massed: [phone]  - Dra. Moye: [phone]  - Dra. Nicole Kindred: [phone]  En caso de inclemencias del [place], por favor llame a Johnsie Kindred principal al [phone] para una actualizacin sobre el [place] de cualquier retraso o cierre.  Consejos para la medicacin en dermatologa: Por favor, guarde las cajas en las que vienen los medicamentos de uso tpico para ayudarle a seguir las instrucciones sobre dnde y cmo usarlos. Las farmacias generalmente imprimen las instrucciones del medicamento slo en las cajas y no directamente en los tubos del [place].   Si su medicamento es muy caro, por favor, pngase en contacto con Zigmund Daniel llamando al [phone] y presione la opcin 4 o envenos un mensaje a travs de Pharmacist, community.   No podemos decirle cul ser su copago por los medicamentos por adelantado ya que esto es  diferente dependiendo de la cobertura de su seguro. Sin embargo, es posible que podamos encontrar un medicamento sustituto a Electrical engineer un formulario para que el seguro cubra el medicamento que se considera necesario.   Si se requiere una autorizacin previa para que su compaa de seguros [place] su medicamento, por favor permtanos de 1 a 2 das hbiles para completar este proceso.  Los precios de los medicamentos varan con frecuencia dependiendo del Environmental consultant de dnde se surte la receta y alguna farmacias pueden ofrecer precios ms baratos.  El sitio web www.goodrx.com tiene cupones para medicamentos de Airline pilot. Los precios aqu no tienen en cuenta lo que podra costar con la ayuda del seguro (puede ser ms barato con su seguro), pero el sitio web puede darle el precio si no utiliz Research scientist (physical sciences).  - Puede imprimir el cupn correspondiente y llevarlo con su receta a la farmacia.  - Tambin puede pasar por nuestra oficina durante el horario de atencin regular y Charity fundraiser una tarjeta de cupones de GoodRx.  - Si necesita que su receta se enve electrnicamente a una farmacia diferente, informe a nuestra oficina a travs de MyChart de [place] o por telfono llamando al [phone] y presione la opcin 4.

## 2022-03-27 ENCOUNTER — Encounter: Payer: Self-pay | Admitting: Dermatology

## 2022-03-28 ENCOUNTER — Telehealth: Payer: Self-pay

## 2022-03-28 NOTE — Telephone Encounter (Signed)
Patient informed of pathology results 

## 2022-03-28 NOTE — Telephone Encounter (Signed)
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

## 2022-06-07 LAB — TSH: TSH: 1.69 (ref 0.41–5.90)

## 2022-06-07 LAB — BASIC METABOLIC PANEL
BUN: 13 (ref 4–21)
CO2: 29 — AB (ref 13–22)
Chloride: 102 (ref 99–108)
Creatinine: 0.9 (ref 0.6–1.3)
Glucose: 99
Potassium: 4.1 mEq/L (ref 3.5–5.1)
Sodium: 142 (ref 137–147)

## 2022-06-07 LAB — CBC AND DIFFERENTIAL
HCT: 46 (ref 41–53)
Hemoglobin: 15.6 (ref 13.5–17.5)
Platelets: 232 10*3/uL (ref 150–400)
WBC: 6.7

## 2022-06-07 LAB — HEPATIC FUNCTION PANEL
ALT: 21 U/L (ref 10–40)
AST: 17 (ref 14–40)
Alkaline Phosphatase: 58 (ref 25–125)
Bilirubin, Total: 1

## 2022-06-07 LAB — COMPREHENSIVE METABOLIC PANEL
Albumin: 3.7 (ref 3.5–5.0)
Calcium: 9.6 (ref 8.7–10.7)
eGFR: >90

## 2022-06-07 LAB — CBC: RBC: 5.47 — AB (ref 3.87–5.11)

## 2022-06-07 LAB — POCT ERYTHROCYTE SEDIMENTATION RATE, NON-AUTOMATED: Sed Rate: 19

## 2022-10-15 ENCOUNTER — Other Ambulatory Visit: Payer: Self-pay

## 2022-10-15 ENCOUNTER — Emergency Department: Payer: 59

## 2022-10-15 ENCOUNTER — Emergency Department
Admission: EM | Admit: 2022-10-15 | Discharge: 2022-10-15 | Disposition: A | Discharge status: Home / Self Care | Payer: 59 | Attending: Emergency Medicine | Admitting: Emergency Medicine

## 2022-10-15 DIAGNOSIS — R55 Syncope and collapse: Secondary | ICD-10-CM | POA: Diagnosis present

## 2022-10-15 LAB — BASIC METABOLIC PANEL
Anion gap: 9 (ref 5–15)
BUN: 14 mg/dL (ref 8–23)
CO2: 23 mmol/L (ref 22–32)
Calcium: 8.9 mg/dL (ref 8.9–10.3)
Chloride: 103 mmol/L (ref 98–111)
Creatinine, Ser: 0.94 mg/dL (ref 0.61–1.24)
GFR, Estimated: >60 mL/min (ref >60)
Glucose, Bld: 124 mg/dL — ABNORMAL HIGH (ref 70–99)
Potassium: 4.2 mmol/L (ref 3.5–5.1)
Sodium: 135 mmol/L (ref 135–145)

## 2022-10-15 LAB — CBC
HCT: 47.3 % (ref 39.0–52.0)
Hemoglobin: 15.8 g/dL (ref 13.0–17.0)
MCH: 28.7 pg (ref 26.0–34.0)
MCHC: 33.4 g/dL (ref 30.0–36.0)
MCV: 85.8 fL (ref 80.0–100.0)
Platelets: 225 10*3/uL (ref 150–400)
RBC: 5.51 MIL/uL (ref 4.22–5.81)
RDW: 12 % (ref 11.5–15.5)
WBC: 7.5 10*3/uL (ref 4.0–10.5)
nRBC: 0 % (ref 0.0–0.2)

## 2022-10-15 LAB — TROPONIN I (HIGH SENSITIVITY): Troponin I (High Sensitivity): 3 ng/L (ref <18)

## 2022-10-15 NOTE — ED Notes (Signed)
Discharge instructions reviewed with patient. Patient questions answered and opportunity for education reviewed. Patient voices understanding of discharge instructions with no further questions. Patient ambulatory with steady gait to lobby.  

## 2022-10-15 NOTE — ED Provider Triage Note (Signed)
Emergency Medicine Provider Triage Evaluation Note  Roberto Holder , a 62 y.o. male  was evaluated in triage.  Pt complains of dizziness, tunnel vision and near "passing out" but did not hit the floor.  Review of Systems  Positive: Dizziness, vision change, facial "weird" Negative: No SOB, no difficulty breathing, no headache, no CP  Physical Exam  BP (!) 180/100   Pulse 77   Temp 97.7 F (36.5 C)   Resp 18   Ht 5' 8.5" (1.74 m)   Wt 81.6 kg   SpO2 100%   BMI 26.97 kg/m  Gen:   Awake, no distress  talkative, answers questions appropriately,alert.  Resp:  Normal effort Clear bilat MSK:   Moves extremities without difficulty  Other:  Cranial nerves 2-12 grossly intact, speech normal, grip strength equal bilat.    Medical Decision Making  Medically screening exam initiated at 12:57 PM.  Appropriate orders placed.  Roberto Holder was informed that the remainder of the evaluation will be completed by another provider, this initial triage assessment does not replace that evaluation, and the importance of remaining in the ED until their evaluation is complete.     Tommi Rumps, PA-C 10/15/22 1301

## 2022-10-15 NOTE — ED Provider Notes (Signed)
[facility] Provider Note    Event Date/Time   First MD Initiated Contact with Patient 10/15/22 1553     (approximate)   History   Chief Complaint Syncope  HPI  Roberto Holder is a 62 y.o. male with no significant past medical history who presents to the ED complaining of syncope.  Patient reports that he was at work earlier today and standing outside with some coworkers when he began to feel very dizzy and lightheaded.  He eventually passed out and began to fall to the ground, but was caught by his coworker before doing so.  He was unconscious for a few seconds, reports feeling dazed with some nausea afterwards.  He denies any associated chest pain or shortness of breath with the episode, does report some tightness in his chest occurring while in the waiting room.  He states this is since resolved and he denies any difficulty breathing.  He is otherwise felt well recently with no fevers, cough, nausea, vomiting, diarrhea, or dysuria.  He does report similar episodes intermittently in the past, is not aware of any specific trigger.  He denies significant cardiac history, does report strong family cardiac history.     Physical Exam   Triage Vital Signs: ED Triage Vitals  Enc Vitals Group     BP 10/15/22 1249 (!) 180/100     Pulse Rate 10/15/22 1249 77     Resp 10/15/22 1252 18     Temp 10/15/22 1255 97.7 F (36.5 C)     Temp src --      SpO2 10/15/22 1249 100 %     Weight 10/15/22 1250 180 lb (81.6 kg)     Height 10/15/22 1250 5' 8.5" (1.74 m)     Head Circumference --      Peak Flow --      Pain Score 10/15/22 1250 0     Pain Loc --      Pain Edu? --      Excl. in GC? --     Most recent vital signs: Vitals:   10/15/22 1255 10/15/22 1706  BP:    Pulse:    Resp:    Temp: 97.7 F (36.5 C) 97.7 F (36.5 C)  SpO2:      Constitutional: Alert and oriented. Eyes: Conjunctivae are normal. Head: Atraumatic. Nose: No  congestion/rhinnorhea. Mouth/Throat: Mucous membranes are moist.  Cardiovascular: Normal rate, regular rhythm. Grossly normal heart sounds.  2+ radial pulses bilaterally. Respiratory: Normal respiratory effort.  No retractions. Lungs CTAB. Gastrointestinal: Soft and nontender. No distention. Musculoskeletal: No lower extremity tenderness nor edema.  Neurologic:  Normal speech and language. No gross focal neurologic deficits are appreciated.    ED Results / Procedures / Treatments   Labs (all labs ordered are listed, but only abnormal results are displayed) Labs Reviewed  BASIC METABOLIC PANEL - Abnormal; Notable for the following components:      Result Value   Glucose, Bld 124 (*)    All other components within normal limits  CBC  URINALYSIS, ROUTINE W REFLEX MICROSCOPIC  TROPONIN I (HIGH SENSITIVITY)     EKG  ED ECG REPORT I, Chesley Noon, the attending physician, personally viewed and interpreted this ECG.   Date: 10/15/2022  EKG Time: 12:50  Rate: 79  Rhythm: normal sinus rhythm  Axis: Normal  Intervals:none  ST&T Change: None  RADIOLOGY CT head reviewed and interpreted by me with no hemorrhage or midline shift.  PROCEDURES:  Critical Care  performed: No  Procedures   MEDICATIONS ORDERED IN ED: Medications - No data to display   IMPRESSION / MDM / ASSESSMENT AND PLAN / ED COURSE  I reviewed the triage vital signs and the nursing notes.                              62 y.o. male with no significant past medical history presents to the ED with syncopal episode preceded by lightheadedness with some chest tightness afterwards, now resolved.  Patient's presentation is most consistent with acute presentation with potential threat to life or bodily function.  Differential diagnosis includes, but is not limited to, ACS, arrhythmia, PE, pneumonia, vasovagal episode, orthostatic hypotension, electrolyte abnormality, anemia, AKI.  Patient nontoxic-appearing and  in no acute distress, vital signs remarkable for hypertension in triage however this is improving by the time of my assessment.  EKG shows no evidence of arrhythmia or ischemia and symptoms sound more consistent with vasovagal episode rather than cardiac etiology.  Will observe on cardiac monitor and screen troponin, initial labs are reassuring with no significant anemia, leukocytosis, tract abnormality, or AKI.  Doubt PE or dissection given no ongoing symptoms with reassuring vitals.    No events noted on cardiac monitor and troponin within normal limits.  Patient asymptomatic here in the ED and is appropriate for discharge home with cardiology follow-up, was counseled to return to the ED for new or worsening symptoms.  Patient agrees with plan.      FINAL CLINICAL IMPRESSION(S) / ED DIAGNOSES   Final diagnoses:  Syncope, unspecified syncope type     Rx / DC Orders   ED Discharge Orders          Ordered    Ambulatory referral to Cardiology        10/15/22 1803             Note:  This document was prepared using Dragon voice recognition software and may include unintentional dictation errors.   Chesley Noon, MD 10/15/22 [id number]

## 2022-10-15 NOTE — ED Triage Notes (Addendum)
Pt to ED POV from work for near syncope. States just finished lunch, started to have tunnel vision and feel like was going to pass out. States face felt hot and weird.  Taking prednisone for sinus infection.  NAD noted. Alert and oriented.

## 2022-10-16 ENCOUNTER — Telehealth: Payer: Self-pay | Admitting: Cardiology

## 2022-10-16 NOTE — Telephone Encounter (Signed)
Patient is requesting provider switch from Dr. Azucena Cecil to Dr. Mariah Milling.

## 2022-10-17 ENCOUNTER — Other Ambulatory Visit
Admission: RE | Admit: 2022-10-17 | Discharge: 2022-10-17 | Disposition: A | Discharge status: Home / Self Care | Payer: 59 | Source: Ambulatory Visit | Attending: Cardiology | Admitting: Cardiology

## 2022-10-17 ENCOUNTER — Ambulatory Visit (INDEPENDENT_AMBULATORY_CARE_PROVIDER_SITE_OTHER): Payer: 59

## 2022-10-17 ENCOUNTER — Encounter: Payer: Self-pay | Admitting: Cardiology

## 2022-10-17 ENCOUNTER — Ambulatory Visit: Payer: 59 | Attending: Cardiology | Admitting: Cardiology

## 2022-10-17 ENCOUNTER — Emergency Department: Payer: 59

## 2022-10-17 ENCOUNTER — Other Ambulatory Visit: Payer: Self-pay

## 2022-10-17 ENCOUNTER — Emergency Department
Admission: EM | Admit: 2022-10-17 | Discharge: 2022-10-17 | Disposition: A | Discharge status: Home / Self Care | Payer: 59 | Attending: Emergency Medicine | Admitting: Emergency Medicine

## 2022-10-17 VITALS — BP 144/80 | HR 88 | Ht 68.5 in | Wt 202.0 lb

## 2022-10-17 DIAGNOSIS — R55 Syncope and collapse: Secondary | ICD-10-CM | POA: Diagnosis not present

## 2022-10-17 DIAGNOSIS — R0602 Shortness of breath: Secondary | ICD-10-CM

## 2022-10-17 DIAGNOSIS — I10 Essential (primary) hypertension: Secondary | ICD-10-CM | POA: Diagnosis not present

## 2022-10-17 DIAGNOSIS — R0789 Other chest pain: Secondary | ICD-10-CM | POA: Insufficient documentation

## 2022-10-17 DIAGNOSIS — Z79899 Other long term (current) drug therapy: Secondary | ICD-10-CM

## 2022-10-17 DIAGNOSIS — E785 Hyperlipidemia, unspecified: Secondary | ICD-10-CM

## 2022-10-17 DIAGNOSIS — R079 Chest pain, unspecified: Secondary | ICD-10-CM | POA: Diagnosis present

## 2022-10-17 LAB — CBC
HCT: 46.7 % (ref 39.0–52.0)
Hemoglobin: 15.7 g/dL (ref 13.0–17.0)
MCH: 28.7 pg (ref 26.0–34.0)
MCHC: 33.6 g/dL (ref 30.0–36.0)
MCV: 85.4 fL (ref 80.0–100.0)
Platelets: 230 10*3/uL (ref 150–400)
RBC: 5.47 MIL/uL (ref 4.22–5.81)
RDW: 12 % (ref 11.5–15.5)
WBC: 7.3 10*3/uL (ref 4.0–10.5)
nRBC: 0 % (ref 0.0–0.2)

## 2022-10-17 LAB — BASIC METABOLIC PANEL
Anion gap: 8 (ref 5–15)
BUN: 14 mg/dL (ref 8–23)
CO2: 23 mmol/L (ref 22–32)
Calcium: 8.7 mg/dL — ABNORMAL LOW (ref 8.9–10.3)
Chloride: 105 mmol/L (ref 98–111)
Creatinine, Ser: 1.03 mg/dL (ref 0.61–1.24)
GFR, Estimated: >60 mL/min (ref >60)
Glucose, Bld: 149 mg/dL — ABNORMAL HIGH (ref 70–99)
Potassium: 4.1 mmol/L (ref 3.5–5.1)
Sodium: 136 mmol/L (ref 135–145)

## 2022-10-17 LAB — LIPID PANEL
Cholesterol: 178 mg/dL (ref 0–200)
HDL: 42 mg/dL (ref >40)
LDL Cholesterol: 103 mg/dL — ABNORMAL HIGH (ref 0–99)
Total CHOL/HDL Ratio: 4.2 RATIO
Triglycerides: 163 mg/dL — ABNORMAL HIGH (ref <150)
VLDL: 33 mg/dL (ref 0–40)

## 2022-10-17 LAB — TROPONIN I (HIGH SENSITIVITY)
Troponin I (High Sensitivity): 3 ng/L (ref <18)
Troponin I (High Sensitivity): 3 ng/L (ref <18)

## 2022-10-17 MED ORDER — ASPIRIN 81 MG PO TBEC: 81.0000 mg | DELAYED_RELEASE_TABLET | Freq: Every day | ORAL | 1 refills | Status: AC

## 2022-10-17 MED ORDER — LOSARTAN POTASSIUM 25 MG PO TABS: 12.5000 mg | ORAL_TABLET | Freq: Every day | ORAL | 1 refills | Status: DC
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
## 2022-10-17 NOTE — Patient Instructions (Addendum)
Medication Instructions:   Your physician recommends that you continue on your current medications as directed. Please refer to the Current Medication list given to you today.   START Losartan 12.5 mg tablet by mouth daily.  START Aspirin 81 mg tablet by mouth daily.  *If you need a refill on your cardiac medications before your next appointment, please call your pharmacy*   Lab Work:  Your physician recommends that you return for lab work TODAY.  Lipid  - Please go to the [facility]. You will check in at the front desk to the right as you walk into the atrium. Valet Parking is offered if needed. - No appointment needed. You may go any day between 7 am and 6 pm.  If you have labs (blood work) drawn today and your tests are completely normal, you will receive your results only by: MyChart Message (if you have MyChart) OR A paper copy in the mail If you have any lab test that is abnormal or we need to change your treatment, we will call you to review the results.   Testing/Procedures:  Cardiopulmonary Exercise Stress Test   A cardiopulmonary stress test is an exercise test for your heart and lungs.   How to prepare for your Stress test:  Do not eat or drink anything 4 hours before the test or as told by your doctor No caffeine for 24 hours prior to test No smoking 24 hours prior to test. Pants are appropriate. Please wear a short sleeve shirt and sports bra if possible. No perfume, cologne or lotion.  Your physician has recommended that you wear a Zio monitor for 14 days. Please place montior after your stress test is completed.  This monitor is a medical device that records the heart's electrical activity. Doctors most often use these monitors to diagnose arrhythmias. Arrhythmias are problems with the speed or rhythm of the heartbeat. The monitor is a small device applied to your chest. You can wear one while you do your normal daily activities. While wearing this  monitor if you have any symptoms to push the button and record what you felt. Once you have worn this monitor for the period of time provider prescribed (Usually 14 days), you will return the monitor device in the postage paid box. Once it is returned they will download the data collected and provide [place] with a report which the provider will then review and we will call you with those results. Important tips:  Avoid showering during the first 24 hours of wearing the monitor. Avoid excessive sweating to help maximize wear time. Do not submerge the device, no hot tubs, and no swimming pools. Keep any lotions or oils away from the patch. After 24 hours you may shower with the patch on. Take brief showers with your back facing the shower head.  Do not remove patch once it has been placed because that will interrupt data and decrease adhesive wear time. Push the button when you have any symptoms and write down what you were feeling. Once you have completed wearing your monitor, remove and place into box which has postage paid and place in your outgoing mailbox.  If for some reason you have misplaced your box then call our office and we can provide another box and/or mail it off for you.       Follow-Up: At [facility], you and your health needs are our priority.  As part of our continuing mission to provide you with  exceptional heart care, we have created designated Provider Care Teams.  These Care Teams include your primary Cardiologist (physician) and Advanced Practice Providers (APPs -  Physician Assistants and Nurse Practitioners) who all work together to provide you with the care you need, when you need it.  We recommend signing up for the patient portal called "MyChart".  Sign up information is provided on this After Visit Summary.  MyChart is used to connect with patients for Virtual Visits (Telemedicine).  Patients are able to view lab/test results, encounter notes, upcoming  appointments, etc.  Non-urgent messages can be sent to your provider as well.   To learn more about what you can do with MyChart, go to [email or web address].    Your next appointment:   6 week(s)  Provider:   You may see or one of the following Advanced Practice Providers on your designated Care Team:   Nicolasa Ducking, NP Eula Listen, PA-C Cadence Fransico Michael, PA-C Charlsie Quest, NP

## 2022-10-17 NOTE — ED Provider Notes (Signed)
[facility] Provider Note    Event Date/Time   First MD Initiated Contact with Patient 10/17/22 [phone]     (approximate)   History   Chest Pain and Near Syncope   HPI  Roberto Holder is a 62 y.o. male who presents with complaints of dizziness and chest discomfort which occurred this morning around 5 AM, now resolved.  Patient was seen here 2 days ago for similar symptoms.  He is concerned because he does have cardiology appointment but not until Monday of next week.  He reports history of cardiac disease in his family.  No shortness of breath reported.  No neurodeficits.  No nausea vomiting.     Physical Exam   Triage Vital Signs: ED Triage Vitals  Enc Vitals Group     BP 10/17/22 0728 (!) 167/89     Pulse Rate 10/17/22 0723 73     Resp 10/17/22 0723 18     Temp 10/17/22 0724 98 F (36.7 C)     Temp src --      SpO2 10/17/22 0723 97 %     Weight --      Height --      Head Circumference --      Peak Flow --      Pain Score 10/17/22 0722 0     Pain Loc --      Pain Edu? --      Excl. in GC? --     Most recent vital signs: Vitals:   10/17/22 0728 10/17/22 1042  BP: (!) 167/89 (!) 144/78  Pulse:  70  Resp:  17  Temp:    SpO2:  98%     General: Awake, no distress.  CV:  Good peripheral perfusion.  Regular rate and rhythm, no murmurs Resp:  Normal effort.  Abd:  No distention.  Other:     ED Results / Procedures / Treatments   Labs (all labs ordered are listed, but only abnormal results are displayed) Labs Reviewed  BASIC METABOLIC PANEL - Abnormal; Notable for the following components:      Result Value   Glucose, Bld 149 (*)    Calcium 8.7 (*)    All other components within normal limits  CBC  TROPONIN I (HIGH SENSITIVITY)  TROPONIN I (HIGH SENSITIVITY)     EKG  ED ECG REPORT I, Jene Every, the attending physician, personally viewed and interpreted this ECG.  Date: 10/17/2022  Rhythm: normal sinus rhythm QRS  Axis: normal Intervals: normal ST/T Wave abnormalities: normal Narrative Interpretation: no evidence of acute ischemia    RADIOLOGY CT imaging performed 2 days ago    PROCEDURES:  Critical Care performed:   Procedures   MEDICATIONS ORDERED IN ED: Medications - No data to display   IMPRESSION / MDM / ASSESSMENT AND PLAN / ED COURSE  I reviewed the triage vital signs and the nursing notes. Patient's presentation is most consistent with acute presentation with potential threat to life or bodily function.  Patient presents with chest discomfort, dizziness as detailed above, symptoms started while lying in bed.  Differential includes ACS, vertigo, vasovagal response, angina.  Essentially asymptomatic now.  EKG high sensitive troponin are reassuring, not consistent with ACS.  No hypotension, no orthostasis  Offered admission however patient declined he would prefer outpatient management.  Was able to get patient appointment with cardiology today at 1:55 PM       FINAL CLINICAL IMPRESSION(S) / ED DIAGNOSES   Final  diagnoses:  Near syncope  Atypical chest pain     Rx / DC Orders   ED Discharge Orders     None        Note:  This document was prepared using Dragon voice recognition software and may include unintentional dictation errors.   Jene Every, MD 10/17/22 1336

## 2022-10-17 NOTE — Discharge Instructions (Signed)
You have an appointment with [facility] today at 1:55, please arrive 15 minutes early

## 2022-10-17 NOTE — ED Triage Notes (Signed)
Pt comes with c/o near syncopal and some cp. Pt states he had episode the other day of the near syncopal and was seen her in ED. Pt has appt with cardio on Monday. Pt states today he had the episode again and then had some cp. Pt states cp is now gone. Pt sates it was left sided and his arm felt numb.   Pt states no sob.

## 2022-10-18 ENCOUNTER — Telehealth: Payer: Self-pay | Admitting: Cardiology

## 2022-10-18 ENCOUNTER — Telehealth: Payer: Self-pay | Admitting: *Deleted

## 2022-10-18 DIAGNOSIS — Z79899 Other long term (current) drug therapy: Secondary | ICD-10-CM

## 2022-10-18 DIAGNOSIS — E785 Hyperlipidemia, unspecified: Secondary | ICD-10-CM

## 2022-10-18 DIAGNOSIS — R55 Syncope and collapse: Secondary | ICD-10-CM

## 2022-10-18 MED ORDER — ROSUVASTATIN CALCIUM 5 MG PO TABS: 5.0000 mg | ORAL_TABLET | Freq: Every day | ORAL | 3 refills | Status: DC

## 2022-10-18 NOTE — Telephone Encounter (Signed)
Pt would like a callback from nurse in regards to his office visit yesterday and today he's not feeling well so he asking for a work note to return back on tomorrow. Please advise.

## 2022-10-18 NOTE — Telephone Encounter (Signed)
The patient called in asking if he could have a doctor's note to be out of work today. He stated that he is too tired to go back to work due to his ED visit previously. The note can be placed in MyChart.

## 2022-10-18 NOTE — Telephone Encounter (Signed)
-----   Message from Charlsie Quest, NP sent at 10/18/2022  7:18 AM EDT ----- LDL of 103 is higher than the goal of 70 or less. Recommend starting rosuvastatin 5 mg daily. Repeat lipid and hepatic panel in 3 months.

## 2022-10-18 NOTE — Telephone Encounter (Signed)
Left a message for the patient to call back.  

## 2022-10-18 NOTE — Telephone Encounter (Signed)
Left voicemail to schedule Lexiscan

## 2022-10-18 NOTE — Telephone Encounter (Signed)
Spoke with patient and reviewed results and recommendations with patient. Inquired if he uses My Chart and he confirmed. Advised that I will send results and instructions. He verbalized understanding with no further questions at this time.

## 2022-10-18 NOTE — Telephone Encounter (Signed)
-----   Message from Charlsie Quest, NP sent at 10/18/2022  7:10 AM EDT ----- Regarding: change testing Don't get me. His ER records finally populated his record. I am not sure if he truly had a syncopal event bu the ER thought so on his arrival. Can we change his stress test to a Lexiscan? I dont wan to put him on the treadmill and he fall. Please and Thank you, Lavonna Rua

## 2022-10-21 ENCOUNTER — Encounter: Payer: Self-pay | Admitting: *Deleted

## 2022-10-21 ENCOUNTER — Ambulatory Visit: Payer: 59 | Admitting: Cardiology

## 2022-10-21 NOTE — Telephone Encounter (Signed)
Spoke with patient to inquire what day he needed the letter to reflect. He stated it was the 19th. Advised that I would get that letter completed and he should be able to see it through My Chart and if not to give [place] a call back. He verbalized understanding with no further questions at this time.

## 2022-10-21 NOTE — Telephone Encounter (Signed)
Lexiscan scheduled.

## 2022-10-22 ENCOUNTER — Encounter
Admission: RE | Admit: 2022-10-22 | Discharge: 2022-10-22 | Disposition: A | Discharge status: Home / Self Care | Payer: 59 | Source: Ambulatory Visit | Attending: Cardiology | Admitting: Cardiology

## 2022-10-22 DIAGNOSIS — R55 Syncope and collapse: Secondary | ICD-10-CM

## 2022-10-22 LAB — NM MYOCAR MULTI W/SPECT W/WALL MOTION / EF
Estimated workload: 1
SDS: 1
Stress Nuclear Isotope Dose: 28.9 mCi

## 2022-10-22 MED ORDER — TECHNETIUM TC 99M TETROFOSMIN IV KIT
10.8800 | PACK | Freq: Once | INTRAVENOUS | Status: AC | PRN
  Administered 2022-10-22: 10.88 via INTRAVENOUS

## 2022-10-22 MED ORDER — TECHNETIUM TC 99M TETROFOSMIN IV KIT
28.7700 | PACK | Freq: Once | INTRAVENOUS | Status: AC | PRN
  Administered 2022-10-22: 28.77 via INTRAVENOUS

## 2022-10-22 MED ORDER — REGADENOSON 0.4 MG/5ML IV SOLN
0.4000 mg | Freq: Once | INTRAVENOUS | Status: AC
  Administered 2022-10-22: 0.4 mg via INTRAVENOUS

## 2022-10-23 LAB — NM MYOCAR MULTI W/SPECT W/WALL MOTION / EF
Exercise duration (min): 0 min
Exercise duration (sec): 0 s
LV dias vol: 90 mL (ref 62–150)
LV sys vol: 28 mL
MPHR: 158 {beats}/min
Nuc Stress EF: 69 %
Peak HR: 96 {beats}/min
Percent HR: 60 %
Rest HR: 64 {beats}/min
Rest Nuclear Isotope Dose: 10.9 mCi
SRS: 1
SSS: 0
ST Depression (mm): 0 mm
TID: 1.12

## 2022-10-23 NOTE — Progress Notes (Signed)
Low risk study. No ischemia or infarct noted. Reassuring study.

## 2022-10-24 ENCOUNTER — Ambulatory Visit: Payer: 59

## 2022-11-18 NOTE — Progress Notes (Signed)
Heart monitor revealed average heart rate 74 beats per minute, no pauses or arrhythmias noted. Nothing to support near syncopal episodes. Continue current medication regimen and keep follow up  as scheduled.

## 2022-12-25 ENCOUNTER — Telehealth: Payer: Self-pay | Admitting: Cardiology

## 2022-12-25 DIAGNOSIS — Z79899 Other long term (current) drug therapy: Secondary | ICD-10-CM

## 2022-12-25 NOTE — Telephone Encounter (Signed)
Pt returning call

## 2022-12-25 NOTE — Telephone Encounter (Signed)
Left a message for the patient to call back.   Charlsie Quest, NP  You4 hours ago (10:53 AM)    Lets increase his Losartan to 25 mg daily. Recheck a BMP in 1 week. Schedule him for a follow-up appointment. Thanks, [employer]

## 2022-12-25 NOTE — Telephone Encounter (Signed)
Pt c/o of Chest Pain: STAT if CP now or developed within 24 hours  1. Are you having CP right now? No   2. Are you experiencing any other symptoms (ex. SOB, nausea, vomiting, sweating)? No   3. How long have you been experiencing CP? Just this morning 4:30-6:30   4. Is your CP continuous or coming and going? Continuous   5. Have you taken Nitroglycerin? No, but pt took 2 aspirin and it went away.   Pt stated this "pressure" was similar to when he was in the hospital. Please advise.  ?

## 2022-12-25 NOTE — Telephone Encounter (Signed)
Pt made aware of recommendations and verbalized understanding Appointment scheduled for 12/26/22 for further evaluations  Medication list update and repeat lab order placed

## 2022-12-25 NOTE — Telephone Encounter (Signed)
Pt called c/o chest pain that occurred roughly around 4:30 am. Pt stated symptoms felt like pressure and squeezing pain. He stated symptoms were similar to last episode. Pt stated he tried to sit up to relieve pain. However, he reported he took 2 ASA and pain resolved around 6 am. He denies SOB or any other symptoms.  Pt also reported his blood pressure has been averaging around 140/60  Will forward to NP for recommendations.   Pt made aware of ED precaution should any new symptoms develop or worsen.

## 2022-12-26 ENCOUNTER — Encounter: Payer: Self-pay | Admitting: Cardiology

## 2022-12-26 ENCOUNTER — Ambulatory Visit: Payer: 59 | Attending: Cardiology | Admitting: Cardiology

## 2022-12-26 VITALS — BP 138/80 | HR 67 | Ht 68.5 in | Wt 204.8 lb

## 2022-12-26 DIAGNOSIS — E785 Hyperlipidemia, unspecified: Secondary | ICD-10-CM | POA: Diagnosis not present

## 2022-12-26 DIAGNOSIS — I10 Essential (primary) hypertension: Secondary | ICD-10-CM | POA: Diagnosis not present

## 2022-12-26 DIAGNOSIS — R072 Precordial pain: Secondary | ICD-10-CM | POA: Diagnosis not present

## 2022-12-26 MED ORDER — ISOSORBIDE MONONITRATE ER 30 MG PO TB24: 15.0000 mg | ORAL_TABLET | Freq: Every day | ORAL | 3 refills | Status: DC

## 2022-12-26 MED ORDER — EZETIMIBE 10 MG PO TABS: 10.0000 mg | ORAL_TABLET | Freq: Every day | ORAL | 3 refills | Status: DC

## 2022-12-26 MED ORDER — LOSARTAN POTASSIUM 25 MG PO TABS: 25.0000 mg | ORAL_TABLET | Freq: Every day | ORAL | 1 refills | Status: DC

## 2022-12-26 NOTE — Patient Instructions (Signed)
Medication Instructions:  Your physician has recommended you make the following change in your medication:   STOP - rosuvastatin (CRESTOR) 5 MG tablet  START - ezetimibe (ZETIA) 10 MG tablet - Take 1 tablet (10 mg total) by mouth daily  START - isosorbide mononitrate (IMDUR) 30 MG 24 hr tablet - Take 0.5 tablets (15 mg total) by mouth daily  START - losartan (COZAAR) 25 MG tablet - Take 1 tablet (25 mg total) by mouth daily  *If you need a refill on your cardiac medications before your next appointment, please call your pharmacy*  Lab Work: -None ordered  Testing/Procedures: -None ordered  Follow-Up: At [facility], you and your health needs are our priority.  As part of our continuing mission to provide you with exceptional heart care, we have created designated Provider Care Teams.  These Care Teams include your primary Cardiologist (physician) and Advanced Practice Providers (APPs -  Physician Assistants and Nurse Practitioners) who all work together to provide you with the care you need, when you need it.  Your next appointment:   2 month(s)  Provider:   You may see Debbe Odea, MD or one of the following Advanced Practice Providers on your designated Care Team:   Nicolasa Ducking, NP Eula Listen, PA-C Cadence Fransico Michael, PA-C Charlsie Quest, NP    Other Instructions -None

## 2022-12-26 NOTE — Progress Notes (Signed)
Cardiology Office Note:    Date:  12/26/2022   ID:  [name], DOB [birth year], MRN [id number]  PCP:  Patient, No Pcp Per  Cardiologist:  Debbe Odea, MD  Electrophysiologist:  None   Referring MD: No ref. provider found   Chief Complaint  Patient presents with   Chest Pain    Patient called office on 12/25/22 to report chest pain episode that was relieved in 45 minutes after taking 2 aspirin (see phone note).  Did take Losartan 25 mg whole tab yesterday and today as advised during triage call for elevated bp.  No chest pains today but is having left arm numbness.    History of Present Illness:    JAAMAL Holder is a 62 y.o. male with history of hypertension, hyperlipidemia presenting with chest pain.  Patient was at home sleeping 2 days ago when he had chest tightness waking him up from sleep.  He took 2 aspirins, with improvement in symptoms couple of hours later.  States having occasional chest tightness not related with exertion.  Has had previous workups including Lexiscan, echo and coronary CT all unrevealing.  States being worried since he has a family history of MI.  States Crestor causes muscle cramping and aches.  Prior notes/studies Cardiac monitor 10/2022, no significant arrhythmias Lexiscan Myoview 09/2022 no significant ischemia, low risk study. Echo 11/2019 EF 60 to 65% Coronary CT 10/2019 calcium score 0.82, minimal nonobstructive disease 0 to 24%.   Past Medical History:  Diagnosis Date   COVID-19 06/20/2021   Medical history non-contributory     Past Surgical History:  Procedure Laterality Date   CHOLECYSTECTOMY     COLONOSCOPY WITH PROPOFOL N/A 08/24/2021   Procedure: COLONOSCOPY WITH PROPOFOL;  Surgeon: Midge Minium, MD;  Location: [facility];  Service: Endoscopy;  Laterality: N/A;   GANGLION CYST EXCISION Right    wrist   KNEE ARTHROTOMY Left 01/27/2018   Procedure: KNEE ARTHROTOMY MEDIAL MENISOUS ROOT REPAIR;  Surgeon: Signa Kell, MD;   Location: [facility];  Service: Orthopedics;  Laterality: Left;   POLYPECTOMY  08/24/2021   Procedure: POLYPECTOMY;  Surgeon: Midge Minium, MD;  Location: [facility];  Service: Endoscopy;;    Current Medications: Current Meds  Medication Sig   albuterol (VENTOLIN HFA) 108 (90 Base) MCG/ACT inhaler Inhale 2 puffs into the lungs every 4 (four) hours as needed.   aspirin EC 81 MG tablet Take 1 tablet (81 mg total) by mouth daily. Swallow whole.   ezetimibe (ZETIA) 10 MG tablet Take 1 tablet (10 mg total) by mouth daily.   isosorbide mononitrate (IMDUR) 30 MG 24 hr tablet Take 0.5 tablets (15 mg total) by mouth daily.   Spacer/Aero-Holding Chambers (AEROCHAMBER MV) inhaler Use as instructed   tadalafil (CIALIS) 10 MG tablet SMARTSIG:1 Tablet(s) By Mouth As Needed   tretinoin (RETIN-A) 0.025 % gel Apply topically at bedtime.   [DISCONTINUED] losartan (COZAAR) 25 MG tablet Take 0.5 tablets (12.5 mg total) by mouth daily.   [DISCONTINUED] rosuvastatin (CRESTOR) 5 MG tablet Take 1 tablet (5 mg total) by mouth daily.     Allergies:   Alendronate, Other, and Tape   Social History   Socioeconomic History   Marital status: Single    Spouse name: Not on file   Number of children: Not on file   Years of education: Not on file   Highest education level: Not on file  Occupational History   Not on file  Tobacco Use   Smoking  status: Never   Smokeless tobacco: Never  Vaping Use   Vaping Use: Never used  Substance and Sexual Activity   Alcohol use: Yes    Alcohol/week: 2.0 standard drinks of alcohol    Types: 2 Cans of beer per week    Comment: socially   Drug use: No   Sexual activity: Not on file  Other Topics Concern   Not on file  Social History Narrative   Not on file   Social Determinants of Health   Financial Resource Strain: Not on file  Food Insecurity: Not on file  Transportation Needs: Not on file  Physical Activity: Not on file  Stress: Not on file   Social Connections: Not on file     Family History: The patient's family history includes Cancer in his father and mother; Heart attack in his brother, brother, and mother; Heart attack (age of onset: 79) in his father; Heart disease in his brother, brother, father, mother, and sister; Hypertension in his mother; Stroke in his mother.  ROS:   Please see the history of present illness.     All other systems reviewed and are negative.  EKGs/Labs/Other Studies Reviewed:    The following studies were reviewed today:   EKG Interpretation Date/Time:  Thursday December 26 2022 12:02:49 EDT Ventricular Rate:  67 PR Interval:  176 QRS Duration:  86 QT Interval:  376 QTC Calculation: 397 R Axis:   24  Text Interpretation: Normal sinus rhythm Normal ECG Confirmed by Debbe Odea ([id number]) on 12/26/2022 12:04:57 PM    Recent Labs: 10/17/2022: BUN 14; Creatinine, Ser 1.03; Hemoglobin 15.7; Platelets 230; Potassium 4.1; Sodium 136  Recent Lipid Panel    Component Value Date/Time   CHOL 178 10/17/2022 1515   TRIG 163 (H) 10/17/2022 1515   HDL 42 10/17/2022 1515   CHOLHDL 4.2 10/17/2022 1515   VLDL 33 10/17/2022 1515   LDLCALC 103 (H) 10/17/2022 1515    Physical Exam:    VS:  BP 138/80 (BP Location: Left Arm, Patient Position: Sitting)   Pulse 67   Ht 5' 8.5" (1.74 m)   Wt 204 lb 12.8 oz (92.9 kg)   SpO2 97%   BMI 30.69 kg/m     Wt Readings from Last 3 Encounters:  12/26/22 204 lb 12.8 oz (92.9 kg)  10/17/22 202 lb (91.6 kg)  10/15/22 180 lb (81.6 kg)     GEN:  Well nourished, well developed in no acute distress HEENT: Normal NECK: No JVD; No carotid bruits CARDIAC: RRR, no murmurs, rubs, gallops RESPIRATORY:  Clear to auscultation without rales, wheezing or rhonchi  ABDOMEN: Soft, non-tender, non-distended MUSCULOSKELETAL:  No edema; No deformity  SKIN: Warm and dry NEUROLOGIC:  Alert and oriented x 3 PSYCHIATRIC:  Normal affect   ASSESSMENT:    1. Precordial  pain   2. Primary hypertension   3. Hyperlipidemia, unspecified hyperlipidemia type    PLAN:    In order of problems listed above:  Chest pain, unsure if this is secondary to vasospasm.  Denies reflux symptoms.  Previous workup with [facility], coronary CT, echo unrevealing.  Start Imdur 15 mg daily.  Has not taken Cialis for over 5 years now.  Patient advised to stop Cialis while on Imdur. Hypertension, continue losartan 25 mg daily.  Imdur as above. Hyperlipidemia, myalgias with Crestor.  Start Zetia 10 mg daily.  Follow-up in 2 months.  This note was generated in part or whole with voice recognition software. Voice recognition is usually  quite accurate but there are transcription errors that can and very often do occur. I apologize for any typographical errors that were not detected and corrected.  Medication Adjustments/Labs and Tests Ordered: Current medicines are reviewed at length with the patient today.  Concerns regarding medicines are outlined above.  Orders Placed This Encounter  Procedures   EKG 12-Lead   Meds ordered this encounter  Medications   isosorbide mononitrate (IMDUR) 30 MG 24 hr tablet    Sig: Take 0.5 tablets (15 mg total) by mouth daily.    Dispense:  45 tablet    Refill:  3   losartan (COZAAR) 25 MG tablet    Sig: Take 1 tablet (25 mg total) by mouth daily.    Dispense:  30 tablet    Refill:  1   ezetimibe (ZETIA) 10 MG tablet    Sig: Take 1 tablet (10 mg total) by mouth daily.    Dispense:  90 tablet    Refill:  3    Patient Instructions  Medication Instructions:  Your physician has recommended you make the following change in your medication:   STOP - rosuvastatin (CRESTOR) 5 MG tablet  START - ezetimibe (ZETIA) 10 MG tablet - Take 1 tablet (10 mg total) by mouth daily  START - isosorbide mononitrate (IMDUR) 30 MG 24 hr tablet - Take 0.5 tablets (15 mg total) by mouth daily  START - losartan (COZAAR) 25 MG tablet - Take 1 tablet (25 mg  total) by mouth daily  *If you need a refill on your cardiac medications before your next appointment, please call your pharmacy*  Lab Work: -None ordered  Testing/Procedures: -None ordered  Follow-Up: At [facility], you and your health needs are our priority.  As part of our continuing mission to provide you with exceptional heart care, we have created designated Provider Care Teams.  These Care Teams include your primary Cardiologist (physician) and Advanced Practice Providers (APPs -  Physician Assistants and Nurse Practitioners) who all work together to provide you with the care you need, when you need it.  Your next appointment:   2 month(s)  Provider:   You may see Debbe Odea, MD or one of the following Advanced Practice Providers on your designated Care Team:   Nicolasa Ducking, NP Eula Listen, PA-C Cadence Fransico Michael, PA-C Charlsie Quest, NP    Other Instructions -None    Signed, Debbe Odea, MD  12/26/2022 1:52 PM    [facility]

## 2023-01-06 ENCOUNTER — Encounter: Payer: Self-pay | Admitting: Cardiology

## 2023-02-14 ENCOUNTER — Encounter: Payer: Self-pay | Admitting: Physician Assistant

## 2023-02-14 ENCOUNTER — Ambulatory Visit (INDEPENDENT_AMBULATORY_CARE_PROVIDER_SITE_OTHER): Payer: 59 | Admitting: Physician Assistant

## 2023-02-14 VITALS — BP 118/78 | HR 75 | Temp 97.9°F | Ht 68.5 in | Wt 210.0 lb

## 2023-02-14 DIAGNOSIS — R739 Hyperglycemia, unspecified: Secondary | ICD-10-CM

## 2023-02-14 DIAGNOSIS — E785 Hyperlipidemia, unspecified: Secondary | ICD-10-CM | POA: Insufficient documentation

## 2023-02-14 DIAGNOSIS — Z131 Encounter for screening for diabetes mellitus: Secondary | ICD-10-CM

## 2023-02-14 DIAGNOSIS — Z8679 Personal history of other diseases of the circulatory system: Secondary | ICD-10-CM | POA: Insufficient documentation

## 2023-02-14 DIAGNOSIS — R6882 Decreased libido: Secondary | ICD-10-CM

## 2023-02-14 DIAGNOSIS — R5383 Other fatigue: Secondary | ICD-10-CM

## 2023-02-14 DIAGNOSIS — I10 Essential (primary) hypertension: Secondary | ICD-10-CM | POA: Diagnosis not present

## 2023-02-14 DIAGNOSIS — Z125 Encounter for screening for malignant neoplasm of prostate: Secondary | ICD-10-CM

## 2023-02-14 DIAGNOSIS — Z1321 Encounter for screening for nutritional disorder: Secondary | ICD-10-CM

## 2023-02-14 DIAGNOSIS — E782 Mixed hyperlipidemia: Secondary | ICD-10-CM | POA: Diagnosis not present

## 2023-02-14 MED ORDER — LOSARTAN POTASSIUM 50 MG PO TABS
50.0000 mg | ORAL_TABLET | Freq: Every day | ORAL | 3 refills | Status: DC
Start: 2023-02-14 — End: 2023-02-25

## 2023-02-14 NOTE — Progress Notes (Signed)
Date:  02/14/2023   Name:  Roberto Holder   DOB:  1961/05/17   MRN:  [id number]   Chief Complaint: Establish Care and Erectile Dysfunction (Pt stated he could not take ED medication and imdur at the same time . Pt wants to discuss his option to take medications for both )  HPI Roberto Holder is a very pleasant 62 year old male with a history of HTN and mild HLD due to the practice today to establish care.  He sees cardiology because he has had syncope 3 times, believes it to be orthostatic/vasovagal, cardiac workup has been normal including EKG, echo, ZIO, Lexiscan, coronary CTA.  They prescribed him rosuvastatin, which caused myalgias.  They then prescribed him Zetia, which he does not want to take because he feels his cholesterol was just barely elevated.  He does endorse family history of MI.  Generally speaking he is a minimalist when it comes to medications.  Home bp usually upper 130's/60's.  Drinks tea fairly regularly.  Takes losartan 25 mg with good compliance, though he admits he would like to get off this medication in the future.  Separately, he endorses fatigue in recent years as well as low libido.  He has never had his testosterone level checked, so no confirmed testosterone deficiency, but suspected.  A third-party provider has given him AndroGel in the past, which resulted in significant symptomatic improvement and he requests this today.  He also takes Cialis on an as-needed basis and would like to continue; for this reason he has not started the Imdur prescribed to him by cardiology for his episodic nonexertional chest tightness.  Chart review shows many hyperglycemic readings on various metabolic panels over the years, almost never glucose below 100.  A1c has never been checked that I can tell.  The 10-year ASCVD risk score (Arnett DK, et al., 2019) is: 10.5%     Medication list has been reviewed and updated.  Current Meds  Medication Sig   aspirin EC 81 MG tablet Take 1 tablet  (81 mg total) by mouth daily. Swallow whole.   B Complex Vitamins (VITAMIN B-COMPLEX PO) Take by mouth.   MAGNESIUM PO Take by mouth.   tadalafil (CIALIS) 10 MG tablet SMARTSIG:1 Tablet(s) By Mouth As Needed   [DISCONTINUED] ezetimibe (ZETIA) 10 MG tablet Take 1 tablet (10 mg total) by mouth daily.   [DISCONTINUED] losartan (COZAAR) 25 MG tablet Take 1 tablet (25 mg total) by mouth daily.   [DISCONTINUED] Multiple Vitamins-Minerals (ZINC PO) Take by mouth.   [DISCONTINUED] rosuvastatin (CRESTOR) 5 MG tablet Take 5 mg by mouth daily.   [DISCONTINUED] VITAMIN D PO Take by mouth.     Review of Systems  Constitutional:  Positive for fatigue. Negative for fever.  Respiratory:  Negative for chest tightness and shortness of breath.   Cardiovascular:  Negative for chest pain and palpitations.  Gastrointestinal:  Negative for abdominal pain.  Genitourinary:        Low libido, ED    Patient Active Problem List   Diagnosis Date Noted   Primary hypertension 02/14/2023   Mixed hyperlipidemia 02/14/2023   Special screening for malignant neoplasms, colon    Polyp of sigmoid colon     Allergies  Allergen Reactions   Alendronate     Other reaction(s): Other (See Comments) Raw throat, bumps inside mouth.   Other Rash    TAPE   Tape Rash    Paper tape ok    Immunization History  Administered Date(s) Administered  Influenza Inj Mdck Quad Pf 05/06/2019   PFIZER(Purple Top)SARS-COV-2 Vaccination 09/11/2019, 10/05/2019    Past Surgical History:  Procedure Laterality Date   CHOLECYSTECTOMY     COLONOSCOPY WITH PROPOFOL N/A 08/24/2021   Procedure: COLONOSCOPY WITH PROPOFOL;  Surgeon: Midge Minium, MD;  Location: [facility];  Service: Endoscopy;  Laterality: N/A;   GANGLION CYST EXCISION Right    wrist   KNEE ARTHROTOMY Left 01/27/2018   Procedure: KNEE ARTHROTOMY MEDIAL MENISOUS ROOT REPAIR;  Surgeon: Signa Kell, MD;  Location: [facility];  Service: Orthopedics;   Laterality: Left;   POLYPECTOMY  08/24/2021   Procedure: POLYPECTOMY;  Surgeon: Midge Minium, MD;  Location: [facility];  Service: Endoscopy;;    Social History   Tobacco Use   Smoking status: Never   Smokeless tobacco: Never  Vaping Use   Vaping status: Never Used  Substance Use Topics   Alcohol use: Yes    Alcohol/week: 2.0 standard drinks of alcohol    Types: 2 Cans of beer per week    Comment: socially   Drug use: No    Family History  Problem Relation Age of Onset   Cancer Mother        colon   Hypertension Mother    Heart attack Mother    Heart disease Mother    Stroke Mother    Cancer Father        colon   Heart attack Father 19   Heart disease Father    Heart disease Sister    Heart attack Brother    Heart disease Brother    Heart attack Brother    Heart disease Brother         02/14/2023    4:14 PM  GAD 7 : Generalized Anxiety Score  Nervous, Anxious, on Edge 0  Control/stop worrying 0  Worry too much - different things 0  Trouble relaxing 0  Restless 0  Easily annoyed or irritable 0  Afraid - awful might happen 0  Total GAD 7 Score 0  Anxiety Difficulty Not difficult at all       02/14/2023    4:14 PM  Depression screen PHQ 2/9  Decreased Interest 0  Down, Depressed, Hopeless 0  PHQ - 2 Score 0  Altered sleeping 0  Tired, decreased energy 0  Change in appetite 0  Feeling bad or failure about yourself  0  Trouble concentrating 0  Moving slowly or fidgety/restless 0  Suicidal thoughts 0  PHQ-9 Score 0  Difficult doing work/chores Not difficult at all    BP Readings from Last 3 Encounters:  02/14/23 118/78  12/26/22 138/80  10/17/22 (!) 144/80    Wt Readings from Last 3 Encounters:  02/14/23 210 lb (95.3 kg)  12/26/22 204 lb 12.8 oz (92.9 kg)  10/17/22 202 lb (91.6 kg)    BP 118/78 (BP Location: Left Arm, Patient Position: Sitting, Cuff Size: Large)   Pulse 75   Temp 97.9 F (36.6 C) (Oral)   Ht 5' 8.5" (1.74 m)    Wt 210 lb (95.3 kg)   SpO2 95%   BMI 31.47 kg/m   Physical Exam Vitals and nursing note reviewed.  Constitutional:      Appearance: Normal appearance.  Neck:     Vascular: No carotid bruit.  Cardiovascular:     Rate and Rhythm: Normal rate and regular rhythm.     Heart sounds: No murmur heard.    No friction rub. No gallop.  Pulmonary:  Effort: Pulmonary effort is normal.     Breath sounds: Normal breath sounds.  Abdominal:     General: There is no distension.  Musculoskeletal:        General: Normal range of motion.  Skin:    General: Skin is warm and dry.  Neurological:     Mental Status: He is alert and oriented to person, place, and time.     Gait: Gait is intact.  Psychiatric:        Mood and Affect: Mood and affect normal.     Recent Labs     Component Value Date/Time   NA 136 10/17/2022 0730   NA 142 06/07/2022 0000   K 4.1 10/17/2022 0730   CL 105 10/17/2022 0730   CO2 23 10/17/2022 0730   GLUCOSE 149 (H) 10/17/2022 0730   BUN 14 10/17/2022 0730   BUN 13 06/07/2022 0000   CREATININE 1.03 10/17/2022 0730   CALCIUM 8.7 (L) 10/17/2022 0730   PROT 8.1 12/22/2021 0858   ALBUMIN 3.7 06/07/2022 0000   AST 17 06/07/2022 0000   ALT 21 06/07/2022 0000   ALKPHOS 58 06/07/2022 0000   BILITOT 1.2 12/22/2021 0858   GFRNONAA >60 10/17/2022 0730   GFRAA >60 11/15/2019 0810    Lab Results  Component Value Date   WBC 7.3 10/17/2022   HGB 15.7 10/17/2022   HCT 46.7 10/17/2022   MCV 85.4 10/17/2022   PLT 230 10/17/2022   No results found for: "HGBA1C" Lab Results  Component Value Date   CHOL 178 10/17/2022   HDL 42 10/17/2022   LDLCALC 103 (H) 10/17/2022   TRIG 163 (H) 10/17/2022   CHOLHDL 4.2 10/17/2022   Lab Results  Component Value Date   TSH 1.69 06/07/2022     Assessment and Plan:  1. Primary hypertension Patient reports home systolic readings in the 130s usually, but curiously on provider recheck blood pressure was 118/78 with high  confidence.  For now continue losartan 25 mg daily, hold Imdur, monitor blood pressure at home 2 times per day for the next week and report back via MyChart.  It should be noted that he is using a wrist cuff at home, and I have advised that an arm cuff would be better.  I have sent a prescription for losartan 50 mg, but I would like for him to hold off on picking this up unless his blood pressures are persistently upper 130s.  Will use caution moving forward considering his history of multiple syncope, possibly orthostatic.  Repeat CMP   - Comprehensive metabolic panel - losartan (COZAAR) 50 MG tablet; Take 1 tablet (50 mg total) by mouth daily.  Dispense: 90 tablet; Refill: 3  2. Mixed hyperlipidemia Patient will return next week for fasting lipids - Lipid panel  3. Fatigue, unspecified type Check vitamin D and testosterone.  Low likelihood of B vitamin deficiency given his supplementation.  CBC and TSH both normal in April. - VITAMIN D 25 Hydroxy (Vit-D Deficiency, Fractures) - Testosterone  4. Low libido Check vitamin D and testosterone.  Will also check PSA especially if considering any type of testosterone therapy in the near future. - VITAMIN D 25 Hydroxy (Vit-D Deficiency, Fractures) - Testosterone - PSA  5. Hyperglycemia Check A1c for multiple hyperglycemic readings on metabolic panels over the years - Hemoglobin A1c  6. Screening for diabetes mellitus Check A1c for multiple hyperglycemic readings on metabolic panels over the years - Hemoglobin A1c  7. Encounter for vitamin deficiency screening  Check vitamin D - VITAMIN D 25 Hydroxy (Vit-D Deficiency, Fractures)  8. Screening PSA (prostate specific antigen) Check PSA - PSA   Return in about 1 month (around 03/17/2023) for OV f/u HTN/fatigue/low T.    Alvester Morin, PA-C, DMSc, Nutritionist [facility] [phone]

## 2023-02-14 NOTE — Patient Instructions (Signed)
-  It was a pleasure to see you today! Please review your visit summary for helpful information -Lab results are usually available within 1-2 days and we will call once reviewed -I would encourage you to follow your care via MyChart where you can access lab results, notes, messages, and more -If you feel that we did a nice job today, please complete your after-visit survey and leave us a Google review! Your CMA today was Kieandra and your provider was Dan Waddell, PA-C, DMSc -Please return for follow-up in about 1 month  

## 2023-02-17 ENCOUNTER — Ambulatory Visit: Payer: Self-pay | Admitting: Physician Assistant

## 2023-02-17 ENCOUNTER — Other Ambulatory Visit
Admission: RE | Admit: 2023-02-17 | Discharge: 2023-02-17 | Disposition: A | Discharge status: Home / Self Care | Payer: 59 | Attending: Physician Assistant | Admitting: Physician Assistant

## 2023-02-17 DIAGNOSIS — Z79899 Other long term (current) drug therapy: Secondary | ICD-10-CM | POA: Diagnosis not present

## 2023-02-17 DIAGNOSIS — R739 Hyperglycemia, unspecified: Secondary | ICD-10-CM | POA: Insufficient documentation

## 2023-02-17 DIAGNOSIS — I10 Essential (primary) hypertension: Secondary | ICD-10-CM | POA: Diagnosis present

## 2023-02-17 DIAGNOSIS — R6882 Decreased libido: Secondary | ICD-10-CM | POA: Diagnosis not present

## 2023-02-17 DIAGNOSIS — E782 Mixed hyperlipidemia: Secondary | ICD-10-CM | POA: Insufficient documentation

## 2023-02-17 DIAGNOSIS — R5383 Other fatigue: Secondary | ICD-10-CM | POA: Insufficient documentation

## 2023-02-17 DIAGNOSIS — Z125 Encounter for screening for malignant neoplasm of prostate: Secondary | ICD-10-CM | POA: Diagnosis not present

## 2023-02-17 DIAGNOSIS — N529 Male erectile dysfunction, unspecified: Secondary | ICD-10-CM | POA: Diagnosis not present

## 2023-02-17 LAB — COMPREHENSIVE METABOLIC PANEL
ALT: 21 U/L (ref 0–44)
AST: 21 U/L (ref 15–41)
Albumin: 4.1 g/dL (ref 3.5–5.0)
Alkaline Phosphatase: 49 U/L (ref 38–126)
Anion gap: 8 (ref 5–15)
BUN: 16 mg/dL (ref 8–23)
CO2: 23 mmol/L (ref 22–32)
Calcium: 8.9 mg/dL (ref 8.9–10.3)
Chloride: 103 mmol/L (ref 98–111)
Creatinine, Ser: 0.94 mg/dL (ref 0.61–1.24)
GFR, Estimated: >60 mL/min (ref >60)
Glucose, Bld: 115 mg/dL — ABNORMAL HIGH (ref 70–99)
Potassium: 4.2 mmol/L (ref 3.5–5.1)
Sodium: 134 mmol/L — ABNORMAL LOW (ref 135–145)
Total Bilirubin: 1.4 mg/dL — ABNORMAL HIGH (ref 0.3–1.2)
Total Protein: 8.1 g/dL (ref 6.5–8.1)

## 2023-02-17 LAB — LIPID PANEL
Cholesterol: 178 mg/dL (ref 0–200)
HDL: 45 mg/dL (ref >40)
LDL Cholesterol: 102 mg/dL — ABNORMAL HIGH (ref 0–99)
Total CHOL/HDL Ratio: 4 RATIO
Triglycerides: 154 mg/dL — ABNORMAL HIGH (ref <150)
VLDL: 31 mg/dL (ref 0–40)

## 2023-02-17 LAB — HEMOGLOBIN A1C
Hgb A1c MFr Bld: 5.9 % — ABNORMAL HIGH (ref 4.8–5.6)
Mean Plasma Glucose: 122.63 mg/dL

## 2023-02-17 LAB — PSA: Prostatic Specific Antigen: 2.06 ng/mL (ref 0.00–4.00)

## 2023-02-17 LAB — VITAMIN D 25 HYDROXY (VIT D DEFICIENCY, FRACTURES): Vit D, 25-Hydroxy: 43.92 ng/mL (ref 30–100)

## 2023-02-18 ENCOUNTER — Other Ambulatory Visit: Payer: Self-pay | Admitting: Physician Assistant

## 2023-02-18 DIAGNOSIS — N529 Male erectile dysfunction, unspecified: Secondary | ICD-10-CM | POA: Insufficient documentation

## 2023-02-18 LAB — TESTOSTERONE: Testosterone: 513 ng/dL (ref 264–916)

## 2023-02-18 MED ORDER — TADALAFIL 10 MG PO TABS
10.0000 mg | ORAL_TABLET | Freq: Every day | ORAL | 2 refills | Status: AC | PRN
Start: 2023-02-18 — End: ?

## 2023-02-20 ENCOUNTER — Ambulatory Visit: Payer: Self-pay | Admitting: Physician Assistant

## 2023-02-25 ENCOUNTER — Ambulatory Visit: Payer: 59 | Attending: Cardiology | Admitting: Cardiology

## 2023-02-25 ENCOUNTER — Encounter: Payer: Self-pay | Admitting: Cardiology

## 2023-02-25 VITALS — BP 140/84 | HR 72 | Ht 68.5 in | Wt 212.4 lb

## 2023-02-25 DIAGNOSIS — R072 Precordial pain: Secondary | ICD-10-CM

## 2023-02-25 DIAGNOSIS — I10 Essential (primary) hypertension: Secondary | ICD-10-CM | POA: Diagnosis not present

## 2023-02-25 NOTE — Progress Notes (Signed)
Cardiology Office Note:    Date:  02/25/2023   ID:  Roberto Holder, DOB 19-Jun-1961, MRN [id number]  PCP:  Remo Lipps, PA  Cardiologist:  Debbe Odea, MD  Electrophysiologist:  None   Referring MD: No ref. provider found   Chief Complaint  Patient presents with   Follow-up    Patient denies new or acute cardiac problems/concerns today.      History of Present Illness:    Roberto Holder is a 62 y.o. male with history of hypertension, hyperlipidemia presenting for follow-up.  Previously seen with chest pain.  Previous workup with [facility] and coronary CT showed no significant CAD.  He has been eating better, trying to lose weight, eating a low-cholesterol low-salt diet.  Blood pressures at home well-controlled with systolics usually in the 118-130s.  States feeling well, denies chest pain.  Prior notes/studies Cardiac monitor 10/2022, no significant arrhythmias Lexiscan Myoview 09/2022 no significant ischemia, low risk study. Echo 11/2019 EF 60 to 65% Coronary CT 10/2019 calcium score 0.82, minimal nonobstructive disease 0 to 24%.   Past Medical History:  Diagnosis Date   COVID-19 06/20/2021   ED (erectile dysfunction)    Hyperlipidemia    Hypertension 05/24   Medical history non-contributory     Past Surgical History:  Procedure Laterality Date   CHOLECYSTECTOMY     COLONOSCOPY WITH PROPOFOL N/A 08/24/2021   Procedure: COLONOSCOPY WITH PROPOFOL;  Surgeon: Midge Minium, MD;  Location: [facility];  Service: Endoscopy;  Laterality: N/A;   GANGLION CYST EXCISION Right    wrist   KNEE ARTHROTOMY Left 01/27/2018   Procedure: KNEE ARTHROTOMY MEDIAL MENISOUS ROOT REPAIR;  Surgeon: Signa Kell, MD;  Location: [facility];  Service: Orthopedics;  Laterality: Left;   POLYPECTOMY  08/24/2021   Procedure: POLYPECTOMY;  Surgeon: Midge Minium, MD;  Location: [facility];  Service: Endoscopy;;    Current Medications: Current Meds   Medication Sig   aspirin EC 81 MG tablet Take 1 tablet (81 mg total) by mouth daily. Swallow whole.   B Complex Vitamins (VITAMIN B-COMPLEX PO) Take by mouth.   losartan (COZAAR) 25 MG tablet Take 25 mg by mouth daily.   MAGNESIUM PO Take by mouth.   tadalafil (CIALIS) 10 MG tablet Take 1 tablet (10 mg total) by mouth daily as needed for erectile dysfunction.     Allergies:   Alendronate, Other, and Tape   Social History   Socioeconomic History   Marital status: Single    Spouse name: Not on file   Number of children: 2   Years of education: Not on file   Highest education level: Associate degree: academic program  Occupational History   Not on file  Tobacco Use   Smoking status: Never   Smokeless tobacco: Never  Vaping Use   Vaping status: Never Used  Substance and Sexual Activity   Alcohol use: Yes    Alcohol/week: 2.0 standard drinks of alcohol    Types: 2 Cans of beer per week    Comment: socially   Drug use: No   Sexual activity: Yes  Other Topics Concern   Not on file  Social History Narrative   Not on file   Social Determinants of Health   Financial Resource Strain: Low Risk  (02/10/2023)   Overall Financial Resource Strain (CARDIA)    Difficulty of Paying Living Expenses: Not hard at all  Food Insecurity: No Food Insecurity (02/10/2023)   Hunger Vital Sign  Worried About Programme researcher, broadcasting/film/video in the Last Year: Never true    Ran Out of Food in the Last Year: Never true  Transportation Needs: No Transportation Needs (02/10/2023)   PRAPARE - Administrator, Civil Service (Medical): No    Lack of Transportation (Non-Medical): No  Physical Activity: Unknown (02/10/2023)   Exercise Vital Sign    Days of Exercise per Week: 0 days    Minutes of Exercise per Session: Not on file  Stress: No Stress Concern Present (02/10/2023)   [employer] of Occupational Health - Occupational Stress Questionnaire    Feeling of Stress : Not at all  Social  Connections: Moderately Integrated (02/10/2023)   Social Connection and Isolation Panel [NHANES]    Frequency of Communication with Friends and Family: More than three times a week    Frequency of Social Gatherings with Friends and Family: Twice a week    Attends Religious Services: More than 4 times per year    Active Member of [employer] or Organizations: Yes    Attends Engineer, structural: More than 4 times per year    Marital Status: Divorced     Family History: The patient's family history includes Cancer in his father and mother; Heart attack in his brother, brother, and mother; Heart attack (age of onset: 21) in his father; Heart disease in his brother, brother, father, mother, and sister; Hypertension in his mother; Stroke in his mother.  ROS:   Please see the history of present illness.     All other systems reviewed and are negative.  EKGs/Labs/Other Studies Reviewed:    The following studies were reviewed today:    Recent Labs: 06/07/2022: TSH 1.69 10/17/2022: Hemoglobin 15.7; Platelets 230 02/17/2023: ALT 21; BUN 16; Creatinine, Ser 0.94; Potassium 4.2; Sodium 134  Recent Lipid Panel    Component Value Date/Time   CHOL 178 02/17/2023 0822   TRIG 154 (H) 02/17/2023 0822   HDL 45 02/17/2023 0822   CHOLHDL 4.0 02/17/2023 0822   VLDL 31 02/17/2023 0822   LDLCALC 102 (H) 02/17/2023 0822    Physical Exam:    VS:  BP (!) 140/84 (BP Location: Left Arm, Patient Position: Sitting, Cuff Size: Normal)   Pulse 72   Ht 5' 8.5" (1.74 m)   Wt 212 lb 6.4 oz (96.3 kg)   SpO2 99%   BMI 31.83 kg/m     Wt Readings from Last 3 Encounters:  02/25/23 212 lb 6.4 oz (96.3 kg)  02/14/23 210 lb (95.3 kg)  12/26/22 204 lb 12.8 oz (92.9 kg)     GEN:  Well nourished, well developed in no acute distress HEENT: Normal NECK: No JVD; No carotid bruits CARDIAC: RRR, no murmurs, rubs, gallops RESPIRATORY:  Clear to auscultation without rales, wheezing or rhonchi  ABDOMEN: Soft,  non-tender, non-distended MUSCULOSKELETAL:  No edema; No deformity  SKIN: Warm and dry NEUROLOGIC:  Alert and oriented x 3 PSYCHIATRIC:  Normal affect   ASSESSMENT:    1. Precordial pain   2. Primary hypertension    PLAN:    In order of problems listed above:  Chest pain, currently resolved, previous workup with Lexiscan Myoview, coronary CT unrevealing.  Echo with normal EF. Hypertension, BP elevated, usually controlled.  Continue losartan 25 mg daily.    Follow-up as needed.   Medication Adjustments/Labs and Tests Ordered: Current medicines are reviewed at length with the patient today.  Concerns regarding medicines are outlined above.  No orders of the  defined types were placed in this encounter.  No orders of the defined types were placed in this encounter.   Patient Instructions  Medication Instructions:   Your physician recommends that you continue on your current medications as directed. Please refer to the Current Medication list given to you today.  *If you need a refill on your cardiac medications before your next appointment, please call your pharmacy*   Lab Work:  None Ordered  If you have labs (blood work) drawn today and your tests are completely normal, you will receive your results only by: MyChart Message (if you have MyChart) OR A paper copy in the mail If you have any lab test that is abnormal or we need to change your treatment, we will call you to review the results.   Testing/Procedures:  None Ordered   Follow-Up: At [facility], you and your health needs are our priority.  As part of our continuing mission to provide you with exceptional heart care, we have created designated Provider Care Teams.  These Care Teams include your primary Cardiologist (physician) and Advanced Practice Providers (APPs -  Physician Assistants and Nurse Practitioners) who all work together to provide you with the care you need, when you need it.  We  recommend signing up for the patient portal called "MyChart".  Sign up information is provided on this After Visit Summary.  MyChart is used to connect with patients for Virtual Visits (Telemedicine).  Patients are able to view lab/test results, encounter notes, upcoming appointments, etc.  Non-urgent messages can be sent to your provider as well.   To learn more about what you can do with MyChart, go to [email or web address].    Your next appointment:    AS NEEDED   Signed, Debbe Odea, MD  02/25/2023 10:40 AM    [facility]

## 2023-02-25 NOTE — Patient Instructions (Signed)
Medication Instructions:   Your physician recommends that you continue on your current medications as directed. Please refer to the Current Medication list given to you today.  *If you need a refill on your cardiac medications before your next appointment, please call your pharmacy*   Lab Work:  None Ordered  If you have labs (blood work) drawn today and your tests are completely normal, you will receive your results only by: MyChart Message (if you have MyChart) OR A paper copy in the mail If you have any lab test that is abnormal or we need to change your treatment, we will call you to review the results.   Testing/Procedures:  None Ordered   Follow-Up: At [facility], you and your health needs are our priority.  As part of our continuing mission to provide you with exceptional heart care, we have created designated Provider Care Teams.  These Care Teams include your primary Cardiologist (physician) and Advanced Practice Providers (APPs -  Physician Assistants and Nurse Practitioners) who all work together to provide you with the care you need, when you need it.  We recommend signing up for the patient portal called "MyChart".  Sign up information is provided on this After Visit Summary.  MyChart is used to connect with patients for Virtual Visits (Telemedicine).  Patients are able to view lab/test results, encounter notes, upcoming appointments, etc.  Non-urgent messages can be sent to your provider as well.   To learn more about what you can do with MyChart, go to [email or web address].    Your next appointment:  AS NEEDED 

## 2023-03-17 ENCOUNTER — Ambulatory Visit (INDEPENDENT_AMBULATORY_CARE_PROVIDER_SITE_OTHER): Payer: 59 | Admitting: Physician Assistant

## 2023-03-17 ENCOUNTER — Encounter: Payer: Self-pay | Admitting: Physician Assistant

## 2023-03-17 VITALS — BP 114/76 | HR 109 | Temp 98.0°F | Ht 68.5 in | Wt 210.0 lb

## 2023-03-17 DIAGNOSIS — I10 Essential (primary) hypertension: Secondary | ICD-10-CM

## 2023-03-17 DIAGNOSIS — R7303 Prediabetes: Secondary | ICD-10-CM | POA: Diagnosis not present

## 2023-03-17 DIAGNOSIS — N529 Male erectile dysfunction, unspecified: Secondary | ICD-10-CM | POA: Diagnosis not present

## 2023-03-17 NOTE — Progress Notes (Signed)
Date:  03/17/2023   Name:  Roberto Holder   DOB:  1961/01/21   MRN:  [id number]   Chief Complaint: Hypertension, Fatigue, and Low testosterone  Hypertension Pertinent negatives include no chest pain, palpitations or shortness of breath.   Roberto Holder presents for 1 month follow-up on chronic conditions.  He reports good compliance with losartan 25 mg daily and Cialis as needed with good effect.  Last visit he complained of fatigue, but all labs normal including testosterone and vitamin D.  Overall feels well today, does not need anything in particular from me.  He is recovering from COVID for the fifth time a few weeks ago, asks about boosting immunity.  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  Says zinc tablets upset his stomach.  Wonders about red light therapy.   Medication list has been reviewed and updated.  Current Meds  Medication Sig   aspirin EC 81 MG tablet Take 1 tablet (81 mg total) by mouth daily. Swallow whole.   B Complex Vitamins (VITAMIN B-COMPLEX PO) Take by mouth.   losartan (COZAAR) 25 MG tablet Take 25 mg by mouth daily.   MAGNESIUM PO Take by mouth.   tadalafil (CIALIS) 10 MG tablet Take 1 tablet (10 mg total) by mouth daily as needed for erectile dysfunction.     Review of Systems  Constitutional:  Negative for fatigue and fever.  Respiratory:  Negative for chest tightness and shortness of breath.   Cardiovascular:  Negative for chest pain and palpitations.  Gastrointestinal:  Negative for abdominal pain.    Patient Active Problem List   Diagnosis Date Noted   Erectile dysfunction 02/18/2023   Primary hypertension 02/14/2023   Mixed hyperlipidemia 02/14/2023   Special screening for malignant neoplasms, colon    Polyp of sigmoid colon     Allergies  Allergen Reactions   Alendronate     Other reaction(s): Other (See Comments) Raw throat, bumps inside mouth.   Other Rash    TAPE   Tape Rash    Paper tape ok    Immunization  History  Administered Date(s) Administered   Influenza Inj Mdck Quad Pf 05/06/2019   PFIZER(Purple Top)SARS-COV-2 Vaccination 09/11/2019, 10/05/2019    Past Surgical History:  Procedure Laterality Date   CHOLECYSTECTOMY     COLONOSCOPY WITH PROPOFOL N/A 08/24/2021   Procedure: COLONOSCOPY WITH PROPOFOL;  Surgeon: Midge Minium, MD;  Location: [facility];  Service: Endoscopy;  Laterality: N/A;   GANGLION CYST EXCISION Right    wrist   KNEE ARTHROTOMY Left 01/27/2018   Procedure: KNEE ARTHROTOMY MEDIAL MENISOUS ROOT REPAIR;  Surgeon: Signa Kell, MD;  Location: [facility];  Service: Orthopedics;  Laterality: Left;   POLYPECTOMY  08/24/2021   Procedure: POLYPECTOMY;  Surgeon: Midge Minium, MD;  Location: [facility];  Service: Endoscopy;;    Social History   Tobacco Use   Smoking status: Never   Smokeless tobacco: Never  Vaping Use   Vaping status: Never Used  Substance Use Topics   Alcohol use: Yes    Alcohol/week: 2.0 standard drinks of alcohol    Types: 2 Cans of beer per week    Comment: socially   Drug use: No    Family History  Problem Relation Age of Onset   Cancer Mother        colon   Hypertension Mother    Heart attack Mother    Heart disease Mother    Stroke Mother  Cancer Father        colon   Heart attack Father 92   Heart disease Father    Heart disease Sister    Heart attack Brother    Heart disease Brother    Heart attack Brother    Heart disease Brother         03/17/2023   10:27 AM 02/14/2023    4:14 PM  GAD 7 : Generalized Anxiety Score  Nervous, Anxious, on Edge 0 0  Control/stop worrying 0 0  Worry too much - different things 0 0  Trouble relaxing 0 0  Restless 0 0  Easily annoyed or irritable 0 0  Afraid - awful might happen 0 0  Total GAD 7 Score 0 0  Anxiety Difficulty Not difficult at all Not difficult at all       03/17/2023   10:27 AM 02/14/2023    4:14 PM  Depression screen PHQ 2/9  Decreased  Interest 0 0  Down, Depressed, Hopeless 0 0  PHQ - 2 Score 0 0  Altered sleeping 0 0  Tired, decreased energy 0 0  Change in appetite 0 0  Feeling bad or failure about yourself  0 0  Trouble concentrating 0 0  Moving slowly or fidgety/restless 0 0  Suicidal thoughts 0 0  PHQ-9 Score 0 0  Difficult doing work/chores Not difficult at all Not difficult at all    BP Readings from Last 3 Encounters:  03/17/23 114/76  02/25/23 (!) 140/84  02/14/23 118/78    Wt Readings from Last 3 Encounters:  03/17/23 210 lb (95.3 kg)  02/25/23 212 lb 6.4 oz (96.3 kg)  02/14/23 210 lb (95.3 kg)    BP 114/76   Pulse (!) 109   Temp 98 F (36.7 C) (Oral)   Ht 5' 8.5" (1.74 m)   Wt 210 lb (95.3 kg)   SpO2 96%   BMI 31.47 kg/m   Physical Exam Vitals and nursing note reviewed.  Constitutional:      Appearance: Normal appearance.  Cardiovascular:     Rate and Rhythm: Normal rate.  Pulmonary:     Effort: Pulmonary effort is normal.  Abdominal:     General: There is no distension.  Musculoskeletal:        General: Normal range of motion.  Skin:    General: Skin is warm and dry.  Neurological:     Mental Status: He is alert and oriented to person, place, and time.     Gait: Gait is intact.  Psychiatric:        Mood and Affect: Mood and affect normal.     Recent Labs     Component Value Date/Time   NA 134 (L) 02/17/2023 0822   NA 142 06/07/2022 0000   K 4.2 02/17/2023 0822   CL 103 02/17/2023 0822   CO2 23 02/17/2023 0822   GLUCOSE 115 (H) 02/17/2023 0822   BUN 16 02/17/2023 0822   BUN 13 06/07/2022 0000   CREATININE 0.94 02/17/2023 0822   CALCIUM 8.9 02/17/2023 0822   PROT 8.1 02/17/2023 0822   ALBUMIN 4.1 02/17/2023 0822   AST 21 02/17/2023 0822   ALT 21 02/17/2023 0822   ALKPHOS 49 02/17/2023 0822   BILITOT 1.4 (H) 02/17/2023 0822   GFRNONAA >60 02/17/2023 0822   GFRAA >60 11/15/2019 0810    Lab Results  Component Value Date   WBC 7.3 10/17/2022   HGB 15.7  10/17/2022   HCT 46.7 10/17/2022  MCV 85.4 10/17/2022   PLT 230 10/17/2022   Lab Results  Component Value Date   HGBA1C 5.9 (H) 02/17/2023   Lab Results  Component Value Date   CHOL 178 02/17/2023   HDL 45 02/17/2023   LDLCALC 102 (H) 02/17/2023   TRIG 154 (H) 02/17/2023   CHOLHDL 4.0 02/17/2023   Lab Results  Component Value Date   TSH 1.69 06/07/2022     Assessment and Plan:  1. Primary hypertension Well-controlled with low-dose of losartan, continue current medication.  2. Erectile dysfunction, unspecified erectile dysfunction type Using Cialis with good effect and no side effects, continue as needed use.  3. Prediabetes Recommend diet low in simple carbohydrates such as white starches (bread, pasta, rice) and refined sugar found in desserts and sweetened beverages including juice, sweet tea, and soda.    Regarding his immunity and frequent COVID infections, we reviewed the importance of routine physical activity, nutritive diet, and high-quality sleep when it comes to immunity.  I am not well-informed on the potential benefits from red light therapy, but I did encourage him to research this and let me know if I can help in any way.  I did recommend zinc lozenges at the onset of cold which have mild evidence to their antiviral qualities  Return in about 6 months (around 09/14/2023) for OV f/u chronic conditions w/ Shingrix.    Alvester Morin, PA-C, DMSc, Nutritionist [facility] [phone]

## 2023-04-02 ENCOUNTER — Ambulatory Visit: Payer: 59 | Admitting: Dermatology

## 2023-04-02 VITALS — BP 121/78 | HR 66

## 2023-04-02 DIAGNOSIS — L719 Rosacea, unspecified: Secondary | ICD-10-CM | POA: Diagnosis not present

## 2023-04-02 DIAGNOSIS — Z1283 Encounter for screening for malignant neoplasm of skin: Secondary | ICD-10-CM

## 2023-04-02 DIAGNOSIS — L718 Other rosacea: Secondary | ICD-10-CM

## 2023-04-02 DIAGNOSIS — L821 Other seborrheic keratosis: Secondary | ICD-10-CM

## 2023-04-02 DIAGNOSIS — D1801 Hemangioma of skin and subcutaneous tissue: Secondary | ICD-10-CM | POA: Diagnosis not present

## 2023-04-02 DIAGNOSIS — D229 Melanocytic nevi, unspecified: Secondary | ICD-10-CM

## 2023-04-02 DIAGNOSIS — L814 Other melanin hyperpigmentation: Secondary | ICD-10-CM

## 2023-04-02 DIAGNOSIS — L711 Rhinophyma: Secondary | ICD-10-CM | POA: Diagnosis not present

## 2023-04-02 DIAGNOSIS — L820 Inflamed seborrheic keratosis: Secondary | ICD-10-CM | POA: Diagnosis not present

## 2023-04-02 DIAGNOSIS — Z79899 Other long term (current) drug therapy: Secondary | ICD-10-CM

## 2023-04-02 DIAGNOSIS — D489 Neoplasm of uncertain behavior, unspecified: Secondary | ICD-10-CM

## 2023-04-02 DIAGNOSIS — Z7189 Other specified counseling: Secondary | ICD-10-CM

## 2023-04-02 DIAGNOSIS — D492 Neoplasm of unspecified behavior of bone, soft tissue, and skin: Secondary | ICD-10-CM

## 2023-04-02 DIAGNOSIS — D1722 Benign lipomatous neoplasm of skin and subcutaneous tissue of left arm: Secondary | ICD-10-CM

## 2023-04-02 DIAGNOSIS — W908XXA Exposure to other nonionizing radiation, initial encounter: Secondary | ICD-10-CM

## 2023-04-02 DIAGNOSIS — Z872 Personal history of diseases of the skin and subcutaneous tissue: Secondary | ICD-10-CM

## 2023-04-02 DIAGNOSIS — L578 Other skin changes due to chronic exposure to nonionizing radiation: Secondary | ICD-10-CM

## 2023-04-02 MED ORDER — DOXYCYCLINE 40 MG PO CPDR
DELAYED_RELEASE_CAPSULE | ORAL | 11 refills | Status: DC
Start: 2023-04-02 — End: 2023-04-02

## 2023-04-02 MED ORDER — DOXYCYCLINE 40 MG PO CPDR
DELAYED_RELEASE_CAPSULE | ORAL | 1 refills | Status: DC
Start: 2023-04-02 — End: 2023-09-15

## 2023-04-02 NOTE — Patient Instructions (Addendum)
Biopsy Wound Care Instructions  Leave the original bandage on for 24 hours if possible.  If the bandage becomes soaked or soiled before that time, it is OK to remove it and examine the wound.  A small amount of post-operative bleeding is normal.  If excessive bleeding occurs, remove the bandage, place gauze over the site and apply continuous pressure (no peeking) over the area for 30 minutes. If this does not work, please call our clinic as soon as possible or page your doctor if it is after hours.   Once a day, cleanse the wound with soap and water. It is fine to shower. If a thick crust develops you may use a Q-tip dipped into dilute hydrogen peroxide (mix 1:1 with water) to dissolve it.  Hydrogen peroxide can slow the healing process, so use it only as needed.    After washing, apply petroleum jelly (Vaseline) or an antibiotic ointment if your doctor prescribed one for you, followed by a bandage.    For best healing, the wound should be covered with a layer of ointment at all times. If you are not able to keep the area covered with a bandage to hold the ointment in place, this may mean re-applying the ointment several times a day.  Continue this wound care until the wound has healed and is no longer open.   Itching and mild discomfort is normal during the healing process. However, if you develop pain or severe itching, please call our office.   If you have any discomfort, you can take Tylenol (acetaminophen) or ibuprofen as directed on the bottle. (Please do not take these if you have an allergy to them or cannot take them for another reason).  Some redness, tenderness and white or yellow material in the wound is normal healing.  If the area becomes very sore and red, or develops a thick yellow-green material (pus), it may be infected; please notify us.    If you have stitches, return to clinic as directed to have the stitches removed. You will continue wound care for 2-3 days after the  stitches are removed.   Wound healing continues for up to one year following surgery. It is not unusual to experience pain in the scar from time to time during the interval.  If the pain becomes severe or the scar thickens, you should notify the office.    A slight amount of redness in a scar is expected for the first six months.  After six months, the redness will fade and the scar will soften and fade.  The color difference becomes less noticeable with time.  If there are any problems, return for a post-op surgery check at your earliest convenience.  To improve the appearance of the scar, you can use silicone scar gel, cream, or sheets (such as Mederma or Serica) every night for up to one year. These are available over the counter (without a prescription).  Please call our office at [phone] for any questions or concerns.      Seborrheic Keratosis  What causes seborrheic keratoses? Seborrheic keratoses are harmless, common skin growths that first appear during adult life.  As time goes by, more growths appear.  Some people may develop a large number of them.  Seborrheic keratoses appear on both covered and uncovered body parts.  They are not caused by sunlight.  The tendency to develop seborrheic keratoses can be inherited.  They vary in color from skin-colored to gray, brown, or even  black.  They can be either smooth or have a rough, warty surface.   Seborrheic keratoses are superficial and look as if they were stuck on the skin.  Under the microscope this type of keratosis looks like layers upon layers of skin.  That is why at times the top layer may seem to fall off, but the rest of the growth remains and re-grows.    Treatment Seborrheic keratoses do not need to be treated, but can easily be removed in the office.  Seborrheic keratoses often cause symptoms when they rub on clothing or jewelry.  Lesions can be in the way of shaving.  If they become inflamed, they can cause itching,  soreness, or burning.  Removal of a seborrheic keratosis can be accomplished by freezing, burning, or surgery. If any spot bleeds, scabs, or grows rapidly, please return to have it checked, as these can be an indication of a skin cancer.  Cryotherapy Aftercare  Wash gently with soap and water everyday.   Apply Vaseline and Band-Aid daily until healed.       Doxycycline should be taken with food to prevent nausea. Do not lay down for 30 minutes after taking. Be cautious with sun exposure and use good sun protection while on this medication. Pregnant women should not take this medication.     Rosacea  What is rosacea? Rosacea (say: ro-zay-sha) is a common skin disease that usually begins as a trend of flushing or blushing easily.  As rosacea progresses, a persistent redness in the center of the face will develop and may gradually spread beyond the nose and cheeks to the forehead and chin.  In some cases, the ears, chest, and back could be affected.  Rosacea may appear as tiny blood vessels or small red bumps that occur in crops.  Frequently they can contain pus, and are called "pustules".  If the bumps do not contain pus, they are referred to as "papules".  Rarely, in prolonged, untreated cases of rosacea, the oil glands of the nose and cheeks may become permanently enlarged.  This is called rhinophyma, and is seen more frequently in men.  Signs and Risks In its beginning stages, rosacea tends to come and go, which makes it difficult to recognize.  It can start as intermittent flushing of the face.  Eventually, blood vessels may become permanently visible.  Pustules and papules can appear, but can be mistaken for adult acne.  People of all races, ages, genders and ethnic groups are at risk of developing rosacea.  However, it is more common in women (especially around menopause) and adults with fair skin between the ages of 9 and 66.  Treatment Dermatologists typically recommend a combination  of treatments to effectively manage rosacea.  Treatment can improve symptoms and may stop the progression of the rosacea.  Treatment may involve both topical and oral medications.  The tetracycline antibiotics are often used for their anti-inflammatory effect; however, because of the possibility of developing antibiotic resistance, they should not be used long term at full dose.  For dilated blood vessels the options include electrodessication (uses electric current through a small needle), laser treatment, and cosmetics to hide the redness.   With all forms of treatment, improvement is a slow process, and patients may not see any results for the first 3-4 weeks.  It is very important to avoid the sun and other triggers.  Patients must wear sunscreen daily.  Skin Care Instructions: Cleanse the skin with a mild soap such  as CeraVe cleanser, Cetaphil cleanser, or Dove soap once or twice daily as needed. Moisturize with Eucerin Redness Relief Daily Perfecting Lotion (has a subtle green tint), CeraVe Moisturizing Cream, or Oil of Olay Daily Moisturizer with sunscreen every morning and/or night as recommended. Makeup should be "non-comedogenic" (won't clog pores) and be labeled "for sensitive skin". Good choices for cosmetics are: Neutrogena, Almay, and Physician's Formula.  Any product with a green tint tends to offset a red complexion. If your eyes are dry and irritated, use artificial tears 2-3 times per day and cleanse the eyelids daily with baby shampoo.  Have your eyes examined at least every 2 years.  Be sure to tell your eye doctor that you have rosacea. Alcoholic beverages tend to cause flushing of the skin, and may make rosacea worse. Always wear sunscreen, protect your skin from extreme hot and cold temperatures, and avoid spicy foods, hot drinks, and mechanical irritation such as rubbing, scrubbing, or massaging the face.  Avoid harsh skin cleansers, cleansing masks, astringents, and exfoliation. If  a particular product burns or makes your face feel tight, then it is likely to flare your rosacea. If you are having difficulty finding a sunscreen that you can tolerate, you may try switching to a chemical-free sunscreen.  These are ones whose active ingredient is zinc oxide or titanium dioxide only.  They should also be fragrance free, non-comedogenic, and labeled for sensitive skin. Rosacea triggers may vary from person to person.  There are a variety of foods that have been reported to trigger rosacea.  Some patients find that keeping a diary of what they were doing when they flared helps them avoid triggers.     Melanoma ABCDEs  Melanoma is the most dangerous type of skin cancer, and is the leading cause of death from skin disease.  You are more likely to develop melanoma if you: Have light-colored skin, light-colored eyes, or red or blond hair Spend a lot of time in the sun Tan regularly, either outdoors or in a tanning bed Have had blistering sunburns, especially during childhood Have a close family member who has had a melanoma Have atypical moles or large birthmarks  Early detection of melanoma is key since treatment is typically straightforward and cure rates are extremely high if we catch it early.   The first sign of melanoma is often a change in a mole or a new dark spot.  The ABCDE system is a way of remembering the signs of melanoma.  A for asymmetry:  The two halves do not match. B for border:  The edges of the growth are irregular. C for color:  A mixture of colors are present instead of an even brown color. D for diameter:  Melanomas are usually (but not always) greater than 6mm - the size of a pencil eraser. E for evolution:  The spot keeps changing in size, shape, and color.  Please check your skin once per month between visits. You can use a small mirror in front and a large mirror behind you to keep an eye on the back side or your body.   If you see any new or  changing lesions before your next follow-up, please call to schedule a visit.  Please continue daily skin protection including broad spectrum sunscreen SPF 30+ to sun-exposed areas, reapplying every 2 hours as needed when you're outdoors.   Staying in the shade or wearing long sleeves, sun glasses (UVA+UVB protection) and wide brim hats (4-inch brim around the  entire circumference of the hat) are also recommended for sun protection.    Due to recent changes in healthcare laws, you may see results of your pathology and/or laboratory studies on MyChart before the doctors have had a chance to review them. We understand that in some cases there may be results that are confusing or concerning to you. Please understand that not all results are received at the same time and often the doctors may need to interpret multiple results in order to provide you with the best plan of care or course of treatment. Therefore, we ask that you please give [place] 2 business days to thoroughly review all your results before contacting the office for clarification. Should we see a critical lab result, you will be contacted sooner.   If You Need Anything After Your Visit  If you have any questions or concerns for your doctor, please call our main line at [phone] and press option 4 to reach your doctor's medical assistant. If no one answers, please leave a voicemail as directed and we will return your call as soon as possible. Messages left after 4 pm will be answered the following business day.   You may also send [place] a message via MyChart. We typically respond to MyChart messages within 1-2 business days.  For prescription refills, please ask your pharmacy to contact our office. Our fax number is [phone].  If you have an urgent issue when the clinic is closed that cannot wait until the next business day, you can page your doctor at the number below.    Please note that while we do our best to be available for urgent  issues outside of office hours, we are not available 24/7.   If you have an urgent issue and are unable to reach [place], you may choose to seek medical care at your doctor's office, retail clinic, urgent care center, or emergency room.  If you have a medical emergency, please immediately call 911 or go to the emergency department.  Pager Numbers  - Dr. Gwen Pounds: [phone]  - Dr. Roseanne Reno: [phone]  - Dr. Katrinka Blazing: [phone]   In the event of inclement weather, please call our main line at [phone] for an update on the status of any delays or closures.  Dermatology Medication Tips: Please keep the boxes that topical medications come in in order to help keep track of the instructions about where and how to use these. Pharmacies typically print the medication instructions only on the boxes and not directly on the medication tubes.   If your medication is too expensive, please contact our office at [phone] option 4 or send [place] a message through MyChart.   We are unable to tell what your co-pay for medications will be in advance as this is different depending on your insurance coverage. However, we may be able to find a substitute medication at lower cost or fill out paperwork to get insurance to cover a needed medication.   If a prior authorization is required to get your medication covered by your insurance company, please allow [place] 1-2 business days to complete this process.  Drug prices often vary depending on where the prescription is filled and some pharmacies may offer cheaper prices.  The website www.goodrx.com contains coupons for medications through different pharmacies. The prices here do not account for what the cost may be with help from insurance (it may be cheaper with your insurance), but the website can give you the price if you did not  use any insurance.  - You can print the associated coupon and take it with your prescription to the pharmacy.  - You may also stop  by our office during regular business hours and pick up a GoodRx coupon card.  - If you need your prescription sent electronically to a different pharmacy, notify our office through [facility] or by phone at [phone] option 4.     Si Usted Necesita Algo Despus de Su Visita  Tambin puede enviarnos un mensaje a travs de Clinical cytogeneticist. Por lo general respondemos a los mensajes de MyChart en el transcurso de 1 a 2 das hbiles.  Para renovar recetas, por favor pida a su farmacia que se ponga en contacto con nuestra oficina. Annie Sable de fax es [place] [phone].  Si tiene un asunto urgente cuando la clnica est cerrada y que no puede esperar hasta el siguiente da hbil, puede llamar/localizar a su doctor(a) al nmero que aparece a continuacin.   Por favor, tenga en cuenta que aunque hacemos todo lo posible para estar disponibles para asuntos urgentes fuera del horario de [place], no estamos disponibles las 24 horas del da, los 7 [address]  [address] de la [place].   Si tiene un problema urgente y no puede comunicarse con nosotros, puede optar por buscar atencin mdica  en el consultorio de su doctor(a), en una clnica privada, en un centro de atencin urgente o en una sala de emergencias.  Si tiene Engineer, drilling, por favor llame inmediatamente al 911 o vaya a la sala de emergencias.  Nmeros de bper  - Dr. Gwen Pounds: [phone]  - Dra. Roseanne Reno: [phone]  - Dr. Katrinka Blazing: [phone]   En caso de inclemencias del tiempo, por favor llame a Lacy Duverney principal al [phone] para una actualizacin sobre el [place] de cualquier retraso o cierre.  Consejos para la medicacin en dermatologa: Por favor, guarde las cajas en las que vienen los medicamentos de uso tpico para ayudarle a seguir las instrucciones sobre dnde y cmo usarlos. Las farmacias generalmente imprimen las instrucciones del medicamento slo en las cajas y no directamente en los tubos del [place].   Si su  medicamento es muy caro, por favor, pngase en contacto con Rolm Gala llamando al [phone] y presione la opcin 4 o envenos un mensaje a travs de Clinical cytogeneticist.   No podemos decirle cul ser su copago por los medicamentos por adelantado ya que esto es diferente dependiendo de la cobertura de su seguro. Sin embargo, es posible que podamos encontrar un medicamento sustituto a Audiological scientist un formulario para que el seguro cubra el medicamento que se considera necesario.   Si se requiere una autorizacin previa para que su compaa de seguros [place] su medicamento, por favor permtanos de 1 a 2 das hbiles para completar [address]  Los precios de los medicamentos varan con frecuencia dependiendo del Environmental consultant de dnde se surte la receta y alguna farmacias pueden ofrecer precios ms baratos.  El sitio web www.goodrx.com tiene cupones para medicamentos de Health and safety inspector. Los precios aqu no tienen en cuenta lo que podra costar con la ayuda del seguro (puede ser ms barato con su seguro), pero el sitio web puede darle el precio si no utiliz Tourist information centre manager.  - Puede imprimir el cupn correspondiente y llevarlo con su receta a la farmacia.  - Tambin puede pasar por nuestra oficina durante el horario de atencin regular y Education officer, museum una tarjeta de cupones de GoodRx.  - Si necesita que su receta se enve  electrnicamente a [place] diferente, informe a nuestra oficina a travs de MyChart de [place] o por telfono llamando al [phone] y presione la opcin 4.

## 2023-04-02 NOTE — Progress Notes (Unsigned)
Follow-Up Visit   Subjective  Roberto Holder is a 62 y.o. male who presents for the following: Skin Cancer Screening and Full Body Skin Exam Hx of rosacea, hx of sks. Reports a spot at right forearm that bleeds and would like to discuss removal. Also reports some age spots he is bothered by.   The patient presents for Total-Body Skin Exam (TBSE) for skin cancer screening and mole check. The patient has spots, moles and lesions to be evaluated, some may be new or changing and the patient may have concern these could be cancer.  The following portions of the chart were reviewed this encounter and updated as appropriate: medications, allergies, medical history  Review of Systems:  No other skin or systemic complaints except as noted in HPI or Assessment and Plan.  Objective  Well appearing patient in no apparent distress; mood and affect are within normal limits.  A full examination was performed including scalp, head, eyes, ears, nose, lips, neck, chest, axillae, abdomen, back, buttocks, bilateral upper extremities, bilateral lower extremities, hands, feet, fingers, toes, fingernails, and toenails. All findings within normal limits unless otherwise noted below.   Relevant physical exam findings are noted in the Assessment and Plan.  b/l arms x 3 (3) Erythematous stuck-on, waxy papule or plaque  Left Forearm - Anterior 0.6 cm red papule         Assessment & Plan   SKIN CANCER SCREENING PERFORMED TODAY.  ACTINIC DAMAGE - Chronic condition, secondary to cumulative UV/sun exposure - diffuse scaly erythematous macules with underlying dyspigmentation - Recommend daily broad spectrum sunscreen SPF 30+ to sun-exposed areas, reapply every 2 hours as needed.  - Staying in the shade or wearing long sleeves, sun glasses (UVA+UVB protection) and wide brim hats (4-inch brim around the entire circumference of the hat) are also recommended for sun protection.  - Call for new or changing  lesions.  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 Condition is bothersome/symptomatic for patient. Currently flared.  Rosacea is a chronic progressive skin condition usually affecting the face of adults, causing redness and/or acne bumps. It is treatable but not curable. It sometimes affects the eyes (ocular rosacea) as well. It may respond to topical and/or systemic medication and can flare with stress, sun exposure, alcohol, exercise, topical steroids (including hydrocortisone/cortisone 10) and some foods.  Daily application of broad spectrum spf 30+ sunscreen to face is recommended to reduce flares.  Patient c/o grittiness of the eyes  Treatment Plan Mild ocular rosacea and rhinophyma Start oracea 40 mg po qd with evening meal  Doxycycline should be taken with food to prevent nausea. Do not lay down for 30 minutes after taking. Be cautious with sun exposure and use good sun protection while on this medication. Pregnant women should not take this medication.   Lipoma  Exam:  7 x 5 cm Subcutaneous rubbery nodule Location: left deltoid   Benign-appearing. Exam most consistent with a lipoma. Discussed that a lipoma is a benign fatty growth that can grow over time and sometimes get irritated. Recommend observation if it is not bothersome or changing. Discussed option of ILK injections or surgical excision to remove it if it is growing, symptomatic, or other changes noted. Please call for new or changing lesions so they can be evaluated.   LENTIGINES, SEBORRHEIC KERATOSES, HEMANGIOMAS -  Benign normal skin lesions - Benign-appearing - Call for any changes  MELANOCYTIC NEVI - Tan-brown and/or pink-flesh-colored symmetric macules and papules - Benign appearing on exam today - Observation - Call clinic for new or  changing moles - Recommend daily use of broad spectrum spf 30+ sunscreen to sun-exposed areas.    Rosacea  Related Medications doxycycline (ORACEA) 40 MG capsule Take 40 mg tab po with evening meal for rosacea  Inflamed seborrheic keratosis (3) b/l arms x 3  Symptomatic, irritating, patient would like treated.  Destruction of lesion - b/l arms x 3 (3) Complexity: simple   Destruction method: cryotherapy   Informed consent: discussed and consent obtained   Timeout:  patient name, date of birth, surgical site, and procedure verified Lesion destroyed using liquid nitrogen: Yes   Region frozen until ice ball extended beyond lesion: Yes   Outcome: patient tolerated procedure well with no complications   Post-procedure details: wound care instructions given    Neoplasm of uncertain behavior Left Forearm - Anterior  Epidermal / dermal shaving  Lesion diameter (cm):  0.6 Informed consent: discussed and consent obtained   Timeout: patient name, date of birth, surgical site, and procedure verified   Procedure prep:  Patient was prepped and draped in usual sterile fashion Prep type:  Isopropyl alcohol Anesthesia: the lesion was anesthetized in a standard fashion   Anesthetic:  1% lidocaine w/ epinephrine 1-100,000 buffered w/ 8.4% NaHCO3 Instrument used: flexible razor blade   Hemostasis achieved with: pressure, aluminum chloride and electrodesiccation   Outcome: patient tolerated procedure well   Post-procedure details: sterile dressing applied and wound care instructions given   Dressing type: bandage and petrolatum    Specimen 1 - Surgical pathology Differential Diagnosis: R/o traumatized hemangioma r/o dysplasia   Check Margins: No  R/o traumatized hemangioma r/o dysplasia    Return in about 1 year (around 04/01/2024) for TBSE.  IAsher Muir, CMA, am acting as scribe for Armida Sans, MD.   Documentation: I have reviewed the above documentation for accuracy and  completeness, and I agree with the above.  Armida Sans, MD

## 2023-04-03 ENCOUNTER — Encounter: Payer: Self-pay | Admitting: Dermatology

## 2023-04-08 ENCOUNTER — Telehealth: Payer: Self-pay

## 2023-04-08 LAB — SURGICAL PATHOLOGY

## 2023-04-08 NOTE — Telephone Encounter (Addendum)
Tried calling patient. No answer. LMOM for patient to call office. ----- Message from Armida Sans sent at 04/08/2023  3:48 PM EDT ----- FINAL DIAGNOSIS        1. Skin, left forearm - anterior :       HEMANGIOMA, BASE INVOLVED   Benign hemangioma = collection of blood vessels No further treatment needed

## 2023-04-09 NOTE — Telephone Encounter (Signed)
Patient advised of BX result. aw

## 2023-04-17 ENCOUNTER — Telehealth: Payer: Self-pay

## 2023-04-17 MED ORDER — DOXYCYCLINE HYCLATE 20 MG PO TABS: 20.0000 mg | ORAL_TABLET | Freq: Two times a day (BID) | ORAL | 1 refills | Status: AC

## 2023-04-17 NOTE — Telephone Encounter (Signed)
Patient calling to see if we started the PA for [facility]

## 2023-04-17 NOTE — Telephone Encounter (Signed)
Doxcycline 40mg  non covered.  Doxycycline 20mg  BID sent in and patient advised. aw

## 2023-09-15 ENCOUNTER — Encounter: Payer: Self-pay | Admitting: Physician Assistant

## 2023-09-15 ENCOUNTER — Ambulatory Visit: Payer: 59 | Admitting: Physician Assistant

## 2023-09-15 VITALS — BP 110/76 | HR 74 | Ht 68.5 in | Wt 205.0 lb

## 2023-09-15 DIAGNOSIS — Z8679 Personal history of other diseases of the circulatory system: Secondary | ICD-10-CM | POA: Diagnosis not present

## 2023-09-15 DIAGNOSIS — J301 Allergic rhinitis due to pollen: Secondary | ICD-10-CM | POA: Insufficient documentation

## 2023-09-15 NOTE — Progress Notes (Signed)
 Date:  09/15/2023   Name:  Roberto Holder   DOB:  1960-11-07   MRN:  [id number]   Chief Complaint: Medical Management of Chronic Issues  HPI Roberto Holder returns to clinic for 18-month follow-up on chronic conditions including HTN, pre-DM, ED.  Last labs 02/17/2023 all normal except for mild hyperbilirubinemia and A1c 5.9%.  Overall he feels that he is doing quite well. He has actually stopped losartan while maintaining good blood pressure control. He has improved his diet by decreasing fried foods and switching to zero-sugar sodas (Zevia). He is down another 5 lbs compared to last visit. Trying to get back in the gym but struggles to find the motivation.   Due for repeat colonoscopy next year Feb 2026 due to adenomatous polyps.   "I think I might be getting a sinus infection." Noticing some sinus congestion since last week when some trees bloomed in his yard.    Medication list has been reviewed and updated.  Current Meds  Medication Sig   aspirin EC 81 MG tablet Take 1 tablet (81 mg total) by mouth daily. Swallow whole. (Patient taking differently: Take 81 mg by mouth as needed. Swallow whole.)   B Complex Vitamins (VITAMIN B-COMPLEX PO) Take by mouth.   MAGNESIUM PO Take by mouth 2 (two) times a week.     Review of Systems  Patient Active Problem List   Diagnosis Date Noted   Hyperbilirubinemia 09/15/2023   Seasonal allergic rhinitis due to pollen 09/15/2023   Prediabetes 03/17/2023   Erectile dysfunction 02/18/2023   History of hypertension 02/14/2023   Mixed hyperlipidemia 02/14/2023   Special screening for malignant neoplasms, colon    History of adenomatous polyp of colon     Allergies  Allergen Reactions   Alendronate     Other reaction(s): Other (See Comments) Raw throat, bumps inside mouth.   Other Rash    TAPE   Tape Rash    Paper tape ok    Immunization History  Administered Date(s) Administered   Influenza Inj Mdck Quad Pf 05/06/2019   PFIZER(Purple  Top)SARS-COV-2 Vaccination 09/11/2019, 10/05/2019    Past Surgical History:  Procedure Laterality Date   CHOLECYSTECTOMY     COLONOSCOPY WITH PROPOFOL N/A 08/24/2021   Procedure: COLONOSCOPY WITH PROPOFOL;  Surgeon: Midge Minium, MD;  Location: [facility];  Service: Endoscopy;  Laterality: N/A;   GANGLION CYST EXCISION Right    wrist   KNEE ARTHROTOMY Left 01/27/2018   Procedure: KNEE ARTHROTOMY MEDIAL MENISOUS ROOT REPAIR;  Surgeon: Signa Kell, MD;  Location: [facility];  Service: Orthopedics;  Laterality: Left;   POLYPECTOMY  08/24/2021   Procedure: POLYPECTOMY;  Surgeon: Midge Minium, MD;  Location: [facility];  Service: Endoscopy;;    Social History   Tobacco Use   Smoking status: Never   Smokeless tobacco: Never  Vaping Use   Vaping status: Never Used  Substance Use Topics   Alcohol use: Yes    Alcohol/week: 2.0 standard drinks of alcohol    Types: 2 Cans of beer per week    Comment: socially   Drug use: No    Family History  Problem Relation Age of Onset   Cancer Mother        colon   Hypertension Mother    Heart attack Mother    Heart disease Mother    Stroke Mother    Cancer Father        colon   Heart attack Father 3  Heart disease Father    Heart disease Sister    Heart attack Brother    Heart disease Brother    Heart attack Brother    Heart disease Brother         09/15/2023    8:39 AM 03/17/2023   10:27 AM 02/14/2023    4:14 PM  GAD 7 : Generalized Anxiety Score  Nervous, Anxious, on Edge 0 0 0  Control/stop worrying 0 0 0  Worry too much - different things 0 0 0  Trouble relaxing 0 0 0  Restless 0 0 0  Easily annoyed or irritable 0 0 0  Afraid - awful might happen 0 0 0  Total GAD 7 Score 0 0 0  Anxiety Difficulty Not difficult at all Not difficult at all Not difficult at all       09/15/2023    8:39 AM 03/17/2023   10:27 AM 02/14/2023    4:14 PM  Depression screen PHQ 2/9  Decreased Interest 0 0 0  Down,  Depressed, Hopeless 0 0 0  PHQ - 2 Score 0 0 0  Altered sleeping  0 0  Tired, decreased energy  0 0  Change in appetite  0 0  Feeling bad or failure about yourself   0 0  Trouble concentrating  0 0  Moving slowly or fidgety/restless  0 0  Suicidal thoughts  0 0  PHQ-9 Score  0 0  Difficult doing work/chores  Not difficult at all Not difficult at all    BP Readings from Last 3 Encounters:  09/15/23 110/76  04/02/23 121/78  03/17/23 114/76    Wt Readings from Last 3 Encounters:  09/15/23 205 lb (93 kg)  03/17/23 210 lb (95.3 kg)  02/25/23 212 lb 6.4 oz (96.3 kg)    BP 110/76   Pulse 74   Ht 5' 8.5" (1.74 m)   Wt 205 lb (93 kg)   SpO2 95%   BMI 30.72 kg/m   Physical Exam Vitals and nursing note reviewed.  Constitutional:      Appearance: Normal appearance.  HENT:     Nose:     Right Sinus: No maxillary sinus tenderness or frontal sinus tenderness.     Left Sinus: No maxillary sinus tenderness or frontal sinus tenderness.  Cardiovascular:     Rate and Rhythm: Normal rate and regular rhythm.     Heart sounds: No murmur heard.    No friction rub. No gallop.  Pulmonary:     Effort: Pulmonary effort is normal.     Breath sounds: Normal breath sounds.  Abdominal:     General: There is no distension.  Musculoskeletal:        General: Normal range of motion.  Skin:    General: Skin is warm and dry.  Neurological:     Mental Status: He is alert and oriented to person, place, and time.     Gait: Gait is intact.  Psychiatric:        Mood and Affect: Mood and affect normal.     Recent Labs     Component Value Date/Time   NA 134 (L) 02/17/2023 0822   NA 142 06/07/2022 0000   K 4.2 02/17/2023 0822   CL 103 02/17/2023 0822   CO2 23 02/17/2023 0822   GLUCOSE 115 (H) 02/17/2023 0822   BUN 16 02/17/2023 0822   BUN 13 06/07/2022 0000   CREATININE 0.94 02/17/2023 0822   CALCIUM 8.9 02/17/2023 0822   PROT  8.1 02/17/2023 0822   ALBUMIN 4.1 02/17/2023 0822   AST 21  02/17/2023 0822   ALT 21 02/17/2023 0822   ALKPHOS 49 02/17/2023 0822   BILITOT 1.4 (H) 02/17/2023 0822   GFRNONAA >60 02/17/2023 0822   GFRAA >60 11/15/2019 0810    Lab Results  Component Value Date   WBC 7.3 10/17/2022   HGB 15.7 10/17/2022   HCT 46.7 10/17/2022   MCV 85.4 10/17/2022   PLT 230 10/17/2022   Lab Results  Component Value Date   HGBA1C 5.9 (H) 02/17/2023   Lab Results  Component Value Date   CHOL 178 02/17/2023   HDL 45 02/17/2023   LDLCALC 102 (H) 02/17/2023   TRIG 154 (H) 02/17/2023   CHOLHDL 4.0 02/17/2023   Lab Results  Component Value Date   TSH 1.69 06/07/2022     Assessment and Plan:  History of hypertension Assessment & Plan: Doing very well without losartan, congratulated on this. Continue healthy lifestyle.    Seasonal allergic rhinitis due to pollen Assessment & Plan: OTC antihistamine PRN. Minimize exposure to known allergens. Patient advised if he feels like he develops a true sinus infection in the next week, he should contact me for antibiotic rx.       Return in about 6 months (around 03/17/2024) for CPE.    Alvester Morin, PA-C, DMSc, Nutritionist [employer] [facility] and [facility] [phone]

## 2023-09-15 NOTE — Assessment & Plan Note (Signed)
 Doing very well without losartan, congratulated on this. Continue healthy lifestyle.

## 2023-09-15 NOTE — Assessment & Plan Note (Signed)
 OTC antihistamine PRN. Minimize exposure to known allergens. Patient advised if he feels like he develops a true sinus infection in the next week, he should contact me for antibiotic rx.

## 2023-09-15 NOTE — Patient Instructions (Signed)
-  It was a pleasure to see you today! Please review your visit summary for helpful information -I would encourage you to follow your care via MyChart where you can access lab results, notes, messages, and more -If you feel that we did a nice job today, please complete your after-visit survey and leave us a Google review! Your CMA today was Kieandra and your provider was Dan Waddell, PA-C, DMSc -Please return for follow-up in about 6 months  

## 2023-09-19 ENCOUNTER — Encounter: Payer: Self-pay | Admitting: Physician Assistant

## 2023-09-19 ENCOUNTER — Other Ambulatory Visit: Payer: Self-pay

## 2023-09-19 MED ORDER — AMOXICILLIN 875 MG PO TABS: 875.0000 mg | ORAL_TABLET | Freq: Two times a day (BID) | ORAL | 0 refills | Status: AC

## 2023-09-19 NOTE — Telephone Encounter (Signed)
 Refill sent ? ?KP ?

## 2023-09-19 NOTE — Telephone Encounter (Signed)
 Please review.  KP

## 2024-01-15 ENCOUNTER — Encounter: Payer: Self-pay | Admitting: Physician Assistant

## 2024-01-15 ENCOUNTER — Ambulatory Visit: Admitting: Physician Assistant

## 2024-01-15 VITALS — BP 132/82 | HR 73 | Temp 98.2°F | Ht 68.5 in | Wt 207.0 lb

## 2024-01-15 DIAGNOSIS — G8929 Other chronic pain: Secondary | ICD-10-CM | POA: Insufficient documentation

## 2024-01-15 DIAGNOSIS — J209 Acute bronchitis, unspecified: Secondary | ICD-10-CM

## 2024-01-15 DIAGNOSIS — M25562 Pain in left knee: Secondary | ICD-10-CM

## 2024-01-15 DIAGNOSIS — R7303 Prediabetes: Secondary | ICD-10-CM | POA: Diagnosis not present

## 2024-01-15 DIAGNOSIS — J301 Allergic rhinitis due to pollen: Secondary | ICD-10-CM | POA: Diagnosis not present

## 2024-01-15 MED ORDER — PREDNISONE 20 MG PO TABS: 20.0000 mg | ORAL_TABLET | Freq: Two times a day (BID) | ORAL | 0 refills | Status: AC

## 2024-01-15 MED ORDER — AZITHROMYCIN 250 MG PO TABS: ORAL_TABLET | ORAL | 0 refills | Status: AC

## 2024-01-15 MED ORDER — METFORMIN HCL 500 MG PO TABS
ORAL_TABLET | ORAL | 1 refills | Status: DC
Start: 2024-01-15 — End: 2024-03-17

## 2024-01-15 NOTE — Progress Notes (Signed)
 Date:  01/15/2024   Name:  Roberto Holder   DOB:  08-25-1960   MRN:  [id number]   Chief Complaint: Weight Loss (Wants to lose weight, tries to exercise but has knee pain), Knee Pain (Left knee, had surgery on knee 5 years ago, painful, getting worse, comes and goes, ), and Sinus Problem (Has a mass, right side, was bleeding, says its a polyp )  Knee Pain  There was no injury mechanism. The pain is present in the left knee. The quality of the pain is described as aching. The pain is at a severity of 4/10. The pain is mild. Pain course: comes and goes. He reports no foreign bodies present. The symptoms are aggravated by movement, weight bearing and palpation. He has tried NSAIDs, rest, heat and ice (voltaren gel) for the symptoms. The treatment provided mild relief.   Roberto Holder presents today with several complaints.   He is interested in pharmacotherapy for weight loss. He admits there is room for improvement in physical activity and nutrition, which are problem areas for him primarily due to work schedule. A friend is on [place], patient asks if he is a candidate for this.   Reports right nasal polyp that bled last week, uses distilled saline lavage at least 4x per week for chronic allergies/sinusitis. Does not use intranasal sprays - questions their long-term safety.   Lungs have felt like they've been filled like a balloon for the last 3 weeks.  Reports some shortness of breath on exertion as a result. Has no chronic lung issues.   Chronic left knee pain worse as of late. History of meniscus repair and patient reports they accidentally cut the bursa during the operation. He does not do much kneeling/bending but says the knee flares up from time to time. He has tried topical diclofenac, physical therapy (distantly), and is not keen on oral NSAIDs. He says he was receiving injections for a time which offered temporary relief.   Medication list has been reviewed and updated.  Current Meds   Medication Sig   aspirin  EC 81 MG tablet Take 1 tablet (81 mg total) by mouth daily. Swallow whole. (Patient taking differently: Take 81 mg by mouth as needed. Swallow whole.)   azithromycin  (ZITHROMAX ) 250 MG tablet Take 2 tablets on day 1, then 1 tablet daily on days 2 through 5   B Complex Vitamins (VITAMIN B-COMPLEX PO) Take by mouth.   MAGNESIUM PO Take by mouth 2 (two) times a week.   metFORMIN  (GLUCOPHAGE ) 500 MG tablet Take 1 tablet (500 mg total) by mouth daily with breakfast for 7 days, THEN 1 tablet (500 mg total) 2 (two) times daily with a meal.   predniSONE  (DELTASONE ) 20 MG tablet Take 1 tablet (20 mg total) by mouth 2 (two) times daily with a meal for 5 days.   tadalafil  (CIALIS ) 10 MG tablet Take 1 tablet (10 mg total) by mouth daily as needed for erectile dysfunction.     Review of Systems  Patient Active Problem List   Diagnosis Date Noted   Chronic pain of left knee 01/15/2024   Hyperbilirubinemia 09/15/2023   Seasonal allergic rhinitis due to pollen 09/15/2023   Prediabetes 03/17/2023   Erectile dysfunction 02/18/2023   History of hypertension 02/14/2023   Mixed hyperlipidemia 02/14/2023   Special screening for malignant neoplasms, colon    History of adenomatous polyp of colon     Allergies  Allergen Reactions   Alendronate     Other  reaction(s): Other (See Comments) Raw throat, bumps inside mouth.   Other Rash    TAPE   Tape Rash    Paper tape ok    Immunization History  Administered Date(s) Administered   Influenza Inj Mdck Quad Pf 05/06/2019   PFIZER(Purple Top)SARS-COV-2 Vaccination 09/11/2019, 10/05/2019    Past Surgical History:  Procedure Laterality Date   CHOLECYSTECTOMY     COLONOSCOPY WITH PROPOFOL  N/A 08/24/2021   Procedure: COLONOSCOPY WITH PROPOFOL ;  Surgeon: Jinny Carmine, MD;  Location: [facility];  Service: Endoscopy;  Laterality: N/A;   GANGLION CYST EXCISION Right    wrist   KNEE ARTHROTOMY Left 01/27/2018   Procedure:  KNEE ARTHROTOMY MEDIAL MENISOUS ROOT REPAIR;  Surgeon: Tobie Priest, MD;  Location: [facility];  Service: Orthopedics;  Laterality: Left;   POLYPECTOMY  08/24/2021   Procedure: POLYPECTOMY;  Surgeon: Jinny Carmine, MD;  Location: [facility];  Service: Endoscopy;;    Social History   Tobacco Use   Smoking status: Never   Smokeless tobacco: Never  Vaping Use   Vaping status: Never Used  Substance Use Topics   Alcohol use: Yes    Alcohol/week: 2.0 standard drinks of alcohol    Types: 2 Cans of beer per week    Comment: socially   Drug use: No    Family History  Problem Relation Age of Onset   Cancer Mother        colon   Hypertension Mother    Heart attack Mother    Heart disease Mother    Stroke Mother    Cancer Father        colon   Heart attack Father 41   Heart disease Father    Heart disease Sister    Heart attack Brother    Heart disease Brother    Heart attack Brother    Heart disease Brother         09/15/2023    8:39 AM 03/17/2023   10:27 AM 02/14/2023    4:14 PM  GAD 7 : Generalized Anxiety Score  Nervous, Anxious, on Edge 0 0 0  Control/stop worrying 0 0 0  Worry too much - different things 0 0 0  Trouble relaxing 0 0 0  Restless 0 0 0  Easily annoyed or irritable 0 0 0  Afraid - awful might happen 0 0 0  Total GAD 7 Score 0 0 0  Anxiety Difficulty Not difficult at all Not difficult at all Not difficult at all       09/15/2023    8:39 AM 03/17/2023   10:27 AM 02/14/2023    4:14 PM  Depression screen PHQ 2/9  Decreased Interest 0 0 0  Down, Depressed, Hopeless 0 0 0  PHQ - 2 Score 0 0 0  Altered sleeping  0 0  Tired, decreased energy  0 0  Change in appetite  0 0  Feeling bad or failure about yourself   0 0  Trouble concentrating  0 0  Moving slowly or fidgety/restless  0 0  Suicidal thoughts  0 0  PHQ-9 Score  0 0  Difficult doing work/chores  Not difficult at all Not difficult at all    BP Readings from Last 3 Encounters:   01/15/24 132/82  09/15/23 110/76  04/02/23 121/78    Wt Readings from Last 3 Encounters:  01/15/24 207 lb (93.9 kg)  09/15/23 205 lb (93 kg)  03/17/23 210 lb (95.3 kg)    BP 132/82  Pulse 73   Temp 98.2 F (36.8 C)   Ht 5' 8.5 (1.74 m)   Wt 207 lb (93.9 kg)   SpO2 97%   BMI 31.02 kg/m   Physical Exam Vitals and nursing note reviewed.  Constitutional:      General: He is not in acute distress.    Appearance: Normal appearance.  HENT:     Right Ear: Tympanic membrane normal.     Left Ear: Tympanic membrane normal.     Ears:     Comments: EAC clear bilaterally with good view of TM which is without effusion or erythema.     Nose: Nose normal.     Comments: Sinuses nontender    Mouth/Throat:     Mouth: Mucous membranes are moist.     Pharynx: No oropharyngeal exudate or posterior oropharyngeal erythema.  Eyes:     Conjunctiva/sclera: Conjunctivae normal.     Pupils: Pupils are equal, round, and reactive to light.  Cardiovascular:     Rate and Rhythm: Normal rate and regular rhythm.     Heart sounds: No murmur heard.    No friction rub. No gallop.  Pulmonary:     Effort: Pulmonary effort is normal.     Breath sounds: Normal breath sounds. No wheezing, rhonchi or rales.  Abdominal:     General: There is no distension.  Musculoskeletal:        General: Normal range of motion.  Lymphadenopathy:     Cervical: No cervical adenopathy.  Skin:    General: Skin is warm and dry.  Neurological:     Mental Status: He is alert and oriented to person, place, and time.     Gait: Gait is intact.  Psychiatric:        Mood and Affect: Mood and affect normal.     Recent Labs     Component Value Date/Time   NA 134 (L) 02/17/2023 0822   NA 142 06/07/2022 0000   K 4.2 02/17/2023 0822   CL 103 02/17/2023 0822   CO2 23 02/17/2023 0822   GLUCOSE 115 (H) 02/17/2023 0822   BUN 16 02/17/2023 0822   BUN 13 06/07/2022 0000   CREATININE 0.94 02/17/2023 0822   CALCIUM  8.9  02/17/2023 0822   PROT 8.1 02/17/2023 0822   ALBUMIN 4.1 02/17/2023 0822   AST 21 02/17/2023 0822   ALT 21 02/17/2023 0822   ALKPHOS 49 02/17/2023 0822   BILITOT 1.4 (H) 02/17/2023 0822   GFRNONAA >60 02/17/2023 0822   GFRAA >60 11/15/2019 0810    Lab Results  Component Value Date   WBC 7.3 10/17/2022   HGB 15.7 10/17/2022   HCT 46.7 10/17/2022   MCV 85.4 10/17/2022   PLT 230 10/17/2022   Lab Results  Component Value Date   HGBA1C 5.9 (H) 02/17/2023   Lab Results  Component Value Date   CHOL 178 02/17/2023   HDL 45 02/17/2023   LDLCALC 102 (H) 02/17/2023   TRIG 154 (H) 02/17/2023   CHOLHDL 4.0 02/17/2023   Lab Results  Component Value Date   TSH 1.69 06/07/2022     Assessment and Plan:  1. Acute bronchitis, unspecified organism (Primary) Given the duration of his cough greater than 3 weeks without improvement, will treat for presumed acute bronchitis with 5-day course of prednisone  twice daily.  Will also cover for atypical pneumonia using azithromycin  as below.  Patient excused from work today and tomorrow, will return Monday.  - predniSONE  (DELTASONE ) 20 MG tablet; Take  1 tablet (20 mg total) by mouth 2 (two) times daily with a meal for 5 days.  Dispense: 10 tablet; Refill: 0 - azithromycin  (ZITHROMAX ) 250 MG tablet; Take 2 tablets on day 1, then 1 tablet daily on days 2 through 5  Dispense: 6 tablet; Refill: 0  2. Seasonal allergic rhinitis due to pollen Encourage patient to use intranasal corticosteroid such as fluticasone, mometasone , triamcinolone.  Patient reassured of long-term safety for these.  Will also be referring to ENT for further evaluation and ongoing management. - Ambulatory referral to ENT  3. Prediabetes Patient informed I do not feel he is the appropriate demographic for GLP1 medications. His BMI is 31 but he has a stocky build with muscle mass, therefore I do not feel he meets criteria for obesity. We reviewed other options for pharmacotherapy  but he is averse to the thought of bupropion or stimulant appetite suppressants, so we will try metformin  as below which may produce modest weight loss and help with his prediabetes. He will not start this medication until he is feeling better from his current illness.  He verbalizes understanding.  - metFORMIN  (GLUCOPHAGE ) 500 MG tablet; Take 1 tablet (500 mg total) by mouth daily with breakfast for 7 days, THEN 1 tablet (500 mg total) 2 (two) times daily with a meal.  Dispense: 90 tablet; Refill: 1  4. Chronic pain of left knee Discussed management options with the patient, but for now he would simply like to consult with our sports medicine physician Dr. Selinda Ku so we will do an internal referral for this.  Will obtain updated knee x-ray today. - DG Knee Complete 4 Views Left; Future    Follow-up with Dr. Ku next available   Rolan Hoyle, PA-C, DMSc, Nutritionist [facility] Primary Care and Sports Medicine [facility] [phone]

## 2024-01-16 ENCOUNTER — Ambulatory Visit
Admission: RE | Admit: 2024-01-16 | Discharge: 2024-01-16 | Disposition: A | Discharge status: Home / Self Care | Source: Ambulatory Visit | Attending: Physician Assistant | Admitting: Physician Assistant

## 2024-01-16 ENCOUNTER — Ambulatory Visit
Admission: RE | Admit: 2024-01-16 | Discharge: 2024-01-16 | Disposition: A | Discharge status: Home / Self Care | Attending: Physician Assistant | Admitting: Physician Assistant

## 2024-01-16 DIAGNOSIS — G8929 Other chronic pain: Secondary | ICD-10-CM

## 2024-01-20 ENCOUNTER — Encounter: Payer: Self-pay | Admitting: Family Medicine

## 2024-01-20 ENCOUNTER — Telehealth: Payer: Self-pay | Admitting: Family Medicine

## 2024-01-20 ENCOUNTER — Ambulatory Visit: Admitting: Family Medicine

## 2024-01-20 VITALS — BP 130/80 | HR 76 | Ht 68.5 in | Wt 206.7 lb

## 2024-01-20 DIAGNOSIS — Q6671 Congenital pes cavus, right foot: Secondary | ICD-10-CM | POA: Diagnosis not present

## 2024-01-20 DIAGNOSIS — Q6672 Congenital pes cavus, left foot: Secondary | ICD-10-CM | POA: Diagnosis not present

## 2024-01-20 DIAGNOSIS — M25562 Pain in left knee: Secondary | ICD-10-CM

## 2024-01-20 DIAGNOSIS — G8929 Other chronic pain: Secondary | ICD-10-CM | POA: Diagnosis not present

## 2024-01-20 MED ORDER — MELOXICAM 15 MG PO TABS
15.0000 mg | ORAL_TABLET | Freq: Every day | ORAL | 0 refills | Status: DC
Start: 2024-01-20 — End: 2024-03-17

## 2024-01-20 NOTE — Telephone Encounter (Signed)
 Please seek authorization for left knee viscosupplementation injections

## 2024-01-20 NOTE — Assessment & Plan Note (Signed)
 History of Present Illness Roberto Holder is a 63 year old male who presents with persistent left knee pain.  Left knee pain - Persistent for several months but represents a long chronic issue - Temporarily alleviated by current prednisone  - Pain with prolonged walking, getting in and out of a car, and ascending or descending stairs - Significantly limits ability to swim and participate in diving, which he previously did frequently - No current knee pain while using prednisone   Post-surgical status of left knee - Underwent knee surgery in 2019 - Unable to bear weight for four weeks postoperatively - Required extensive physical therapy after surgery - Continues to have pain and minimal muscle mass around the knee despite therapy  Prior treatments and medication effects - Received cortisone injections with limited relief - Has not had gel injections in the left knee; received them in the contralateral knee four years ago with resolution of symptoms in that leg - Tried taping and bracing without significant improvement - Previously used meloxicam , which caused altered mental status ('felt loopy'), and is cautious about future use - Prefers to avoid long-term medication use  Biomechanical factors - High arch in foot - Notices wear on the outside of his shoes  Physical Exam Left Knee Exam INSPECTION: No gross abnormalities, no swelling, well-healed surgical scars PALPATION: No palpable effusion, Non-tender lateral patellar facet, Significant tenderness at the medial joint line anteromedially and posterior aspect of the medial knee, Medial patellar facet tenderness. RANGE OF MOTION (ROM) ASSESSMENT: 0 to 130 degrees with painless terminal flexion NEUROMUSCULAR: Grossly intact SPECIAL TESTS: Subtle anterior drawer laxity with a firm endpoint, contralateral benign, Posterior drawer testing benign, Medial and lateral McMurray tests equivocal with only mild elicitation of medial pain, Subtle  painful laxity during varus stressing, Valgus stressing benign  Results RADIOLOGY Left knee X-ray: Moderate osteoarthritis, more pronounced medially. Small osteophytes surrounding the patella. 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 X-rays show moderate arthritis, more pronounced patellofemoral and medially, with osteophytes and patellar maltracking. 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 - Refer to podiatry for custom orthotics evaluation to improve foot positioning and associated knee alignment. - Prescribe meloxicam  15 mg once daily for anti-inflammatory effect after completing current prednisone  course. - Request insurance authorization for gel injections for knee. - Discuss potential for platelet-rich plasma (PRP) injections if gel injections are insufficient. - Consider diclofenac if meloxicam  causes adverse effects.

## 2024-01-20 NOTE — Telephone Encounter (Signed)
 Completed  JJM

## 2024-01-20 NOTE — Progress Notes (Signed)
 Primary Care / Sports Medicine Office Visit  Patient Information:  Patient ID: Roberto Holder, male DOB: [birth year] Age: 63 y.o. MRN: [id number]   Roberto Holder is a pleasant 64 y.o. male presenting with the following:  Chief Complaint  Patient presents with   Knee Pain    Left knee pain x a few months. Patient has pain on and off on the medial aspect of his left knee. He has been taking prednisone  and it has helped. He does have history of meniscus repair. Patient has xray's. Patient had PT years ago but it did not help. He also had a cortisone injection int he past and it helped.    Vitals:   01/20/24 0828  BP: 130/80  Pulse: 76  SpO2: 97%   Vitals:   01/20/24 0828  Weight: 206 lb 11.2 oz (93.8 kg)  Height: 5' 8.5 (1.74 m)   Body mass index is 30.97 kg/m.  DG Knee Complete 4 Views Left Result Date: 01/19/2024 CLINICAL DATA:  Chronic left knee pain.  Remote meniscal repair. EXAM: LEFT KNEE - COMPLETE 4+ VIEW COMPARISON:  MRI 05/22/2018 FINDINGS: Mild medial tibiofemoral joint space narrowing. Mild tricompartmental peripheral spurring. Subchondral cystic change in the patellofemoral compartment. No fracture, erosion, or focal bone abnormality. Presumed postsurgical lucency in the proximal tibia. No joint effusion. Unremarkable soft tissues. IMPRESSION: Mild tricompartmental osteoarthritis. Electronically Signed   By: Andrea Gasman M.D.   On: 01/19/2024 23:31     Independent interpretation of notes and tests performed by another provider:   See below, x-rays independently reviewed and interpreted with patient.  Procedures performed:   None  Pertinent History, Exam, Impression, and Recommendations:   Problem List Items Addressed This Visit     Chronic pain of left knee - Primary   History of Present Illness Roberto Holder is a 63 year old male who presents with persistent left knee pain.  Left knee pain - Persistent for several months but represents a long  chronic issue - Temporarily alleviated by current prednisone  - Pain with prolonged walking, getting in and out of a car, and ascending or descending stairs - Significantly limits ability to swim and participate in diving, which he previously did frequently - No current knee pain while using prednisone   Post-surgical status of left knee - Underwent knee surgery in 2019 - Unable to bear weight for four weeks postoperatively - Required extensive physical therapy after surgery - Continues to have pain and minimal muscle mass around the knee despite therapy  Prior treatments and medication effects - Received cortisone injections with limited relief - Has not had gel injections in the left knee; received them in the contralateral knee four years ago with resolution of symptoms in that leg - Tried taping and bracing without significant improvement - Previously used meloxicam , which caused altered mental status ('felt loopy'), and is cautious about future use - Prefers to avoid long-term medication use  Biomechanical factors - High arch in foot - Notices wear on the outside of his shoes  Physical Exam Left Knee Exam INSPECTION: No gross abnormalities, no swelling, well-healed surgical scars PALPATION: No palpable effusion, Non-tender lateral patellar facet, Significant tenderness at the medial joint line anteromedially and posterior aspect of the medial knee, Medial patellar facet tenderness. RANGE OF MOTION (ROM) ASSESSMENT: 0 to 130 degrees with painless terminal flexion NEUROMUSCULAR: Grossly intact SPECIAL TESTS: Subtle anterior drawer laxity with a firm endpoint, contralateral benign, Posterior drawer testing benign, Medial and lateral  McMurray tests equivocal with only mild elicitation of medial pain, Subtle painful laxity during varus stressing, Valgus stressing benign  Results RADIOLOGY Left knee X-ray: Moderate osteoarthritis, more pronounced medially. Small osteophytes surrounding  the patella. 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 X-rays show moderate arthritis, more pronounced patellofemoral and medially, with osteophytes and patellar maltracking. 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 - Refer to podiatry for custom orthotics evaluation to improve foot positioning and associated knee alignment. - Prescribe meloxicam  15 mg once daily for anti-inflammatory effect after completing current prednisone  course. - Request insurance authorization for gel injections for knee. - Discuss potential for platelet-rich plasma (PRP) injections if gel injections are insufficient. - Consider diclofenac if meloxicam  causes adverse effects.      Relevant Medications   meloxicam  (MOBIC ) 15 MG tablet   Other Relevant Orders   Ambulatory referral to Podiatry   Congenital bilateral pes cavus   Relevant Orders   Ambulatory referral to Podiatry     Orders & Medications Medications:  Meds ordered this encounter  Medications   meloxicam  (MOBIC ) 15 MG tablet    Sig: Take 1 tablet (15 mg total) by mouth daily.    Dispense:  30 tablet    Refill:  0   Orders Placed This Encounter  Procedures   Ambulatory referral to Podiatry     No follow-ups on file.     Selinda JINNY Ku, MD, [facility]   Primary Care Sports Medicine Primary Care and Sports Medicine at [facility]

## 2024-01-20 NOTE — Patient Instructions (Signed)
 Patient Plan for Post-Visit Guidance  1. Left Knee Osteoarthritis:    - Refer to podiatry for an evaluation for custom orthotics to improve foot positioning and knee alignment.    - Take meloxicam  15 mg once daily for inflammation after completing the current prednisone  course. Take with food.    - Await insurance authorization for gel injections for the knee.    - Discuss the possibility of platelet-rich plasma (PRP) injections if gel injections are not effective.    - Consider using diclofenac if you experience adverse effects from meloxicam .  Red Flags: - If you experience increased pain, swelling, or any new symptoms, contact the office immediately.

## 2024-01-21 ENCOUNTER — Other Ambulatory Visit: Payer: Self-pay

## 2024-01-21 ENCOUNTER — Telehealth: Payer: Self-pay

## 2024-01-21 DIAGNOSIS — M1712 Unilateral primary osteoarthritis, left knee: Secondary | ICD-10-CM

## 2024-01-21 MED ORDER — EUFLEXXA 20 MG/2ML IX SOSY
20.0000 mg | PREFILLED_SYRINGE | INTRA_ARTICULAR | 0 refills | Status: DC
Start: 2024-01-21 — End: 2024-02-25

## 2024-01-21 NOTE — Telephone Encounter (Signed)
     Copied from CRM [id number]. Topic: General - Other >> Jan 21, 2024 10:11 AM Tobias CROME wrote: Reason for CRM: Falysha w/ [employer] wanting to go over benefits for knee injections for patient.   Requesting callback number: [phone] ext [phone]

## 2024-01-23 ENCOUNTER — Ambulatory Visit: Admitting: Physician Assistant

## 2024-01-23 ENCOUNTER — Ambulatory Visit
Admission: RE | Admit: 2024-01-23 | Discharge: 2024-01-23 | Disposition: A | Discharge status: Home / Self Care | Source: Ambulatory Visit | Attending: Physician Assistant | Admitting: Physician Assistant

## 2024-01-23 ENCOUNTER — Encounter: Payer: Self-pay | Admitting: Physician Assistant

## 2024-01-23 ENCOUNTER — Ambulatory Visit
Admission: RE | Admit: 2024-01-23 | Discharge: 2024-01-23 | Disposition: A | Discharge status: Home / Self Care | Attending: Physician Assistant | Admitting: Physician Assistant

## 2024-01-23 VITALS — BP 118/76 | HR 68 | Temp 97.4°F | Ht 68.5 in | Wt 208.0 lb

## 2024-01-23 DIAGNOSIS — R053 Chronic cough: Secondary | ICD-10-CM | POA: Diagnosis not present

## 2024-01-23 HISTORY — DX: Chronic cough: R05.3

## 2024-01-23 MED ORDER — MONTELUKAST SODIUM 10 MG PO TABS
10.0000 mg | ORAL_TABLET | Freq: Every day | ORAL | 3 refills | Status: AC
Start: 2024-01-23 — End: ?

## 2024-01-23 NOTE — Assessment & Plan Note (Signed)
 Reassuring exam today.  Lack of response to azithromycin  and prednisone  would suggest that this is probably not infectious etiology.  Will be obtaining chest x-ray and referring to pulmonology.  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  Will also be trying montelukast to see if this makes any difference in the event that this could be an allergic cough.

## 2024-01-23 NOTE — Progress Notes (Signed)
 Date:  01/23/2024   Name:  Roberto Holder   DOB:  Jun 21, 1961   MRN:  [id number]   Chief Complaint: Acute bronchitis (Trouble breathing, feels like someone is blowing his chest up with a balloon )  HPI Roberto Holder returns for follow-up on persistent cough and the feeling of hyperinflated lungs.  Last visit he was presumed to have acute bronchitis and treated with azithromycin  and prednisone , but states neither helped.  He is not particularly worse, but also not better.  Last chest xray from just over a year ago mentioned Evidence on prior radiographs of some chronic pulmonary interstitial changes.  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   Medication list has been reviewed and updated.  Current Meds  Medication Sig   aspirin  EC 81 MG tablet Take 1 tablet (81 mg total) by mouth daily. Swallow whole. (Patient taking differently: Take 81 mg by mouth as needed. Swallow whole.)   B Complex Vitamins (VITAMIN B-COMPLEX PO) Take by mouth.   MAGNESIUM PO Take by mouth 2 (two) times a week.   meloxicam  (MOBIC ) 15 MG tablet Take 1 tablet (15 mg total) by mouth daily.   metFORMIN  (GLUCOPHAGE ) 500 MG tablet Take 1 tablet (500 mg total) by mouth daily with breakfast for 7 days, THEN 1 tablet (500 mg total) 2 (two) times daily with a meal.   montelukast (SINGULAIR) 10 MG tablet Take 1 tablet (10 mg total) by mouth at bedtime.   tadalafil  (CIALIS ) 10 MG tablet Take 1 tablet (10 mg total) by mouth daily as needed for erectile dysfunction.     Review of Systems  Patient Active Problem List   Diagnosis Date Noted   Persistent cough for 3 weeks or longer 01/23/2024   Congenital bilateral pes cavus 01/20/2024   Chronic pain of left knee 01/15/2024   Hyperbilirubinemia 09/15/2023   Seasonal allergic rhinitis due to pollen 09/15/2023   Prediabetes 03/17/2023   Erectile dysfunction 02/18/2023   History of hypertension 02/14/2023   Mixed  hyperlipidemia 02/14/2023   Special screening for malignant neoplasms, colon    History of adenomatous polyp of colon     Allergies  Allergen Reactions   Alendronate     Other reaction(s): Other (See Comments) Raw throat, bumps inside mouth.   Other Rash    TAPE   Tape Rash    Paper tape ok    Immunization History  Administered Date(s) Administered   Influenza Inj Mdck Quad Pf 05/06/2019   PFIZER(Purple Top)SARS-COV-2 Vaccination 09/11/2019, 10/05/2019    Past Surgical History:  Procedure Laterality Date   CHOLECYSTECTOMY     COLONOSCOPY WITH PROPOFOL  N/A 08/24/2021   Procedure: COLONOSCOPY WITH PROPOFOL ;  Surgeon: Jinny Carmine, MD;  Location: [facility];  Service: Endoscopy;  Laterality: N/A;   GANGLION CYST EXCISION Right    wrist   KNEE ARTHROTOMY Left 01/27/2018   Procedure: KNEE ARTHROTOMY MEDIAL MENISOUS ROOT REPAIR;  Surgeon: Tobie Priest, MD;  Location: [facility];  Service: Orthopedics;  Laterality: Left;   POLYPECTOMY  08/24/2021   Procedure: POLYPECTOMY;  Surgeon: Jinny Carmine, MD;  Location: [facility];  Service: Endoscopy;;    Social History   Tobacco Use   Smoking status: Never   Smokeless tobacco: Never  Vaping Use   Vaping status: Never Used  Substance Use Topics   Alcohol use: Yes    Alcohol/week: 2.0 standard drinks of alcohol    Types: 2 Cans of beer  per week    Comment: socially   Drug use: No    Family History  Problem Relation Age of Onset   Cancer Mother        colon   Hypertension Mother    Heart attack Mother    Heart disease Mother    Stroke Mother    Cancer Father        colon   Heart attack Father 49   Heart disease Father    Heart disease Sister    Heart attack Brother    Heart disease Brother    Heart attack Brother    Heart disease Brother         01/23/2024    2:43 PM 09/15/2023    8:39 AM 03/17/2023   10:27 AM 02/14/2023    4:14 PM  GAD 7 : Generalized Anxiety Score  Nervous, Anxious,  on Edge 0 0 0 0  Control/stop worrying 0 0 0 0  Worry too much - different things 0 0 0 0  Trouble relaxing 0 0 0 0  Restless 0 0 0 0  Easily annoyed or irritable 0 0 0 0  Afraid - awful might happen 0 0 0 0  Total GAD 7 Score 0 0 0 0  Anxiety Difficulty Not difficult at all Not difficult at all Not difficult at all Not difficult at all       01/23/2024    2:43 PM 09/15/2023    8:39 AM 03/17/2023   10:27 AM  Depression screen PHQ 2/9  Decreased Interest 0 0 0  Down, Depressed, Hopeless 0 0 0  PHQ - 2 Score 0 0 0  Altered sleeping   0  Tired, decreased energy   0  Change in appetite   0  Feeling bad or failure about yourself    0  Trouble concentrating   0  Moving slowly or fidgety/restless   0  Suicidal thoughts   0  PHQ-9 Score   0  Difficult doing work/chores   Not difficult at all    BP Readings from Last 3 Encounters:  01/23/24 118/76  01/20/24 130/80  01/15/24 132/82    Wt Readings from Last 3 Encounters:  01/23/24 208 lb (94.3 kg)  01/20/24 206 lb 11.2 oz (93.8 kg)  01/15/24 207 lb (93.9 kg)    BP 118/76   Pulse 68   Temp (!) 97.4 F (36.3 C)   Ht 5' 8.5 (1.74 m)   Wt 208 lb (94.3 kg)   SpO2 96%   BMI 31.17 kg/m   Physical Exam Vitals and nursing note reviewed.  Constitutional:      Appearance: Normal appearance.  Cardiovascular:     Rate and Rhythm: Normal rate and regular rhythm.     Heart sounds: No murmur heard.    No friction rub. No gallop.  Pulmonary:     Effort: Pulmonary effort is normal.     Breath sounds: Normal breath sounds.  Abdominal:     General: There is no distension.  Musculoskeletal:        General: Normal range of motion.  Skin:    General: Skin is warm and dry.  Neurological:     Mental Status: He is alert and oriented to person, place, and time.     Gait: Gait is intact.  Psychiatric:        Mood and Affect: Mood and affect normal.     Recent Labs     Component Value Date/Time  NA 134 (L) 02/17/2023 0822    NA 142 06/07/2022 0000   K 4.2 02/17/2023 0822   CL 103 02/17/2023 0822   CO2 23 02/17/2023 0822   GLUCOSE 115 (H) 02/17/2023 0822   BUN 16 02/17/2023 0822   BUN 13 06/07/2022 0000   CREATININE 0.94 02/17/2023 0822   CALCIUM  8.9 02/17/2023 0822   PROT 8.1 02/17/2023 0822   ALBUMIN 4.1 02/17/2023 0822   AST 21 02/17/2023 0822   ALT 21 02/17/2023 0822   ALKPHOS 49 02/17/2023 0822   BILITOT 1.4 (H) 02/17/2023 0822   GFRNONAA >60 02/17/2023 0822   GFRAA >60 11/15/2019 0810    Lab Results  Component Value Date   WBC 7.3 10/17/2022   HGB 15.7 10/17/2022   HCT 46.7 10/17/2022   MCV 85.4 10/17/2022   PLT 230 10/17/2022   Lab Results  Component Value Date   HGBA1C 5.9 (H) 02/17/2023   Lab Results  Component Value Date   CHOL 178 02/17/2023   HDL 45 02/17/2023   LDLCALC 102 (H) 02/17/2023   TRIG 154 (H) 02/17/2023   CHOLHDL 4.0 02/17/2023   Lab Results  Component Value Date   TSH 1.69 06/07/2022     Assessment and Plan:  Persistent cough for 3 weeks or longer Assessment & Plan: Reassuring exam today.  Lack of response to azithromycin  and prednisone  would suggest that this is probably not infectious etiology.  Will be obtaining chest x-ray and referring to pulmonology.  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  Will also be trying montelukast to see if this makes any difference in the event that this could be an allergic cough.  Orders: -     Montelukast Sodium; Take 1 tablet (10 mg total) by mouth at bedtime.  Dispense: 30 tablet; Refill: 3 -     DG Chest 2 View; Future -     Pulmonary Visit     No follow-ups on file.    Rolan Hoyle, PA-C, DMSc, Nutritionist [facility] Primary Care and Sports Medicine [facility] [phone]

## 2024-01-26 ENCOUNTER — Ambulatory Visit
Admission: EM | Admit: 2024-01-26 | Discharge: 2024-01-26 | Disposition: A | Discharge status: Home / Self Care | Attending: Physician Assistant | Admitting: Physician Assistant

## 2024-01-26 ENCOUNTER — Encounter: Payer: Self-pay | Admitting: Emergency Medicine

## 2024-01-26 DIAGNOSIS — L739 Follicular disorder, unspecified: Secondary | ICD-10-CM | POA: Diagnosis not present

## 2024-01-26 DIAGNOSIS — R21 Rash and other nonspecific skin eruption: Secondary | ICD-10-CM

## 2024-01-26 MED ORDER — PREDNISONE 10 MG PO TABS: ORAL_TABLET | ORAL | 0 refills | Status: DC

## 2024-01-26 MED ORDER — DOXYCYCLINE HYCLATE 100 MG PO CAPS: 100.0000 mg | ORAL_CAPSULE | Freq: Two times a day (BID) | ORAL | 0 refills | Status: AC

## 2024-01-26 NOTE — ED Triage Notes (Signed)
 Pt presents with a rash on the left side of his face that feels numb since last night. Pt denies any pain.

## 2024-01-26 NOTE — Discharge Instructions (Addendum)
-   Folliculitis versus skin irritation/allergen. - I sent doxycycline  for folliculitis.  If your symptoms are due to phlebitis the symptoms should improve within a couple days of starting the antibiotic. - If symptoms or not improving or she would like to start it sooner take the prednisone  taper as prescribed. - Nondrowsy Claritin during the day and Benadryl at night. - Try to identify any potential triggers. - If rash is not improving, please make an appointment with your primary care provider.  May need allergy testing or other treatment.

## 2024-01-26 NOTE — ED Provider Notes (Signed)
 [facility]    CSN: [id number] Arrival date & time: 01/26/24  1937      History   Chief Complaint Chief Complaint  Patient presents with   Rash    HPI Roberto Holder is a 63 y.o. male.   HPI  Past Medical History:  Diagnosis Date   COVID-19 06/20/2021   ED (erectile dysfunction)    Hyperlipidemia    Hypertension 05/24   Medical history non-contributory     Patient Active Problem List   Diagnosis Date Noted   Persistent cough for 3 weeks or longer 01/23/2024   Congenital bilateral pes cavus 01/20/2024   Chronic pain of left knee 01/15/2024   Hyperbilirubinemia 09/15/2023   Seasonal allergic rhinitis due to pollen 09/15/2023   Prediabetes 03/17/2023   Erectile dysfunction 02/18/2023   History of hypertension 02/14/2023   Mixed hyperlipidemia 02/14/2023   Special screening for malignant neoplasms, colon    History of adenomatous polyp of colon     Past Surgical History:  Procedure Laterality Date   CHOLECYSTECTOMY     COLONOSCOPY WITH PROPOFOL  N/A 08/24/2021   Procedure: COLONOSCOPY WITH PROPOFOL ;  Surgeon: Jinny Carmine, MD;  Location: [facility];  Service: Endoscopy;  Laterality: N/A;   GANGLION CYST EXCISION Right    wrist   KNEE ARTHROTOMY Left 01/27/2018   Procedure: KNEE ARTHROTOMY MEDIAL MENISOUS ROOT REPAIR;  Surgeon: Tobie Priest, MD;  Location: [facility];  Service: Orthopedics;  Laterality: Left;   POLYPECTOMY  08/24/2021   Procedure: POLYPECTOMY;  Surgeon: Jinny Carmine, MD;  Location: [facility];  Service: Endoscopy;;       Home Medications    Prior to Admission medications   Medication Sig Start Date End Date Taking? Authorizing Provider  doxycycline  (VIBRAMYCIN ) 100 MG capsule Take 1 capsule (100 mg total) by mouth 2 (two) times daily for 7 days. 01/26/24 02/02/24 Yes Arvis Jolan NOVAK, PA-C  predniSONE  (DELTASONE ) 10 MG tablet Take 6 tabs p.o. on day 1 and decrease by 1 tab daily until complete 01/26/24   Yes Arvis Jolan NOVAK DEVONNA  aspirin  EC 81 MG tablet Take 1 tablet (81 mg total) by mouth daily. Swallow whole. Patient taking differently: Take 81 mg by mouth as needed. Swallow whole. 10/17/22   Gerard Frederick, NP  B Complex Vitamins (VITAMIN B-COMPLEX PO) Take by mouth.    [provider]  MAGNESIUM PO Take by mouth 2 (two) times a week.    [provider]  meloxicam  (MOBIC ) 15 MG tablet Take 1 tablet (15 mg total) by mouth daily. 01/20/24   Matthews, Jason J, MD  metFORMIN  (GLUCOPHAGE ) 500 MG tablet Take 1 tablet (500 mg total) by mouth daily with breakfast for 7 days, THEN 1 tablet (500 mg total) 2 (two) times daily with a meal. 01/15/24 04/18/24  Manya Toribio SQUIBB, PA  montelukast  (SINGULAIR ) 10 MG tablet Take 1 tablet (10 mg total) by mouth at bedtime. 01/23/24   Manya Toribio SQUIBB, PA  Sodium Hyaluronate, Viscosup, (EUFLEXXA) 20 MG/2ML SOSY Inject 20 mg into the articular space once a week. x3 weeks.  Dx Left Knee osteoarthritis. Patient not taking: Reported on 01/23/2024 01/21/24   Alvia Selinda PARAS, MD  tadalafil  (CIALIS ) 10 MG tablet Take 1 tablet (10 mg total) by mouth daily as needed for erectile dysfunction. 02/18/23   Manya Toribio SQUIBB, PA    Family History Family History  Problem Relation Age of Onset   Cancer Mother  colon   Hypertension Mother    Heart attack Mother    Heart disease Mother    Stroke Mother    Cancer Father        colon   Heart attack Father 60   Heart disease Father    Heart disease Sister    Heart attack Brother    Heart disease Brother    Heart attack Brother    Heart disease Brother     Social History Social History   Tobacco Use   Smoking status: Never   Smokeless tobacco: Never  Vaping Use   Vaping status: Never Used  Substance Use Topics   Alcohol use: Yes    Alcohol/week: 2.0 standard drinks of alcohol    Types: 2 Cans of beer per week    Comment: socially   Drug use: No     Allergies   Alendronate, Other, and  Tape   Review of Systems Review of Systems   Physical Exam Triage Vital Signs ED Triage Vitals [01/26/24 1947]  Encounter Vitals Group     BP (!) 145/90     Girls Systolic BP Percentile      Girls Diastolic BP Percentile      Boys Systolic BP Percentile      Boys Diastolic BP Percentile      Pulse Rate 63     Resp 16     Temp 98.1 F (36.7 C)     Temp Source Oral     SpO2 97 %     Weight      Height      Head Circumference      Peak Flow      Pain Score 0     Pain Loc      Pain Education      Exclude from Growth Chart    No data found.  Updated Vital Signs BP (!) 145/90 (BP Location: Left Arm)   Pulse 63   Temp 98.1 F (36.7 C) (Oral)   Resp 16   SpO2 97%    Physical Exam Vitals and nursing note reviewed.  Constitutional:      General: He is not in acute distress.    Appearance: He is well-developed.  HENT:     Head: Normocephalic and atraumatic.  Eyes:     Conjunctiva/sclera: Conjunctivae normal.  Cardiovascular:     Rate and Rhythm: Normal rate and regular rhythm.     Heart sounds: No murmur heard. Pulmonary:     Effort: Pulmonary effort is normal. No respiratory distress.     Breath sounds: Normal breath sounds.  Abdominal:     Palpations: Abdomen is soft.     Tenderness: There is no abdominal tenderness.  Musculoskeletal:        General: No swelling.     Cervical back: Neck supple.  Skin:    General: Skin is warm and dry.     Capillary Refill: Capillary refill takes less than 2 seconds.  Neurological:     Mental Status: He is alert.  Psychiatric:        Mood and Affect: Mood normal.           UC Treatments / Results  Labs (all labs ordered are listed, but only abnormal results are displayed) Labs Reviewed - No data to display  EKG   Radiology No results found.  Procedures Procedures (including critical care time)  Medications Ordered in UC Medications - No data to display  Initial Impression /  Assessment and Plan / UC  Course  I have reviewed the triage vital signs and the nursing notes.  Pertinent labs & imaging results that were available during my care of the patient were reviewed by me and considered in my medical decision making (see chart for details).     *** Final Clinical Impressions(s) / UC Diagnoses   Final diagnoses:  Rash and nonspecific skin eruption  Folliculitis     Discharge Instructions      - Folliculitis versus skin irritation/allergen. - I sent doxycycline  for folliculitis.  If your symptoms are due to phlebitis the symptoms should improve within a couple days of starting the antibiotic. - If symptoms or not improving or she would like to start it sooner take the prednisone  taper as prescribed. - Nondrowsy Claritin during the day and Benadryl at night. - Try to identify any potential triggers. - If rash is not improving, please make an appointment with your primary care provider.  May need allergy testing or other treatment.   ED Prescriptions     Medication Sig Dispense Auth. Provider   predniSONE  (DELTASONE ) 10 MG tablet Take 6 tabs p.o. on day 1 and decrease by 1 tab daily until complete 21 tablet Arvis Huxley B, PA-C   doxycycline  (VIBRAMYCIN ) 100 MG capsule Take 1 capsule (100 mg total) by mouth 2 (two) times daily for 7 days. 14 capsule Arvis Huxley NOVAK, PA-C      PDMP not reviewed this encounter.

## 2024-01-27 ENCOUNTER — Ambulatory Visit: Admitting: Podiatry

## 2024-01-30 ENCOUNTER — Ambulatory Visit: Payer: Self-pay | Admitting: Physician Assistant

## 2024-02-03 ENCOUNTER — Telehealth: Payer: Self-pay

## 2024-02-03 ENCOUNTER — Ambulatory Visit: Admitting: Podiatry

## 2024-02-03 DIAGNOSIS — Q6671 Congenital pes cavus, right foot: Secondary | ICD-10-CM | POA: Diagnosis not present

## 2024-02-03 DIAGNOSIS — Q6672 Congenital pes cavus, left foot: Secondary | ICD-10-CM | POA: Diagnosis not present

## 2024-02-03 DIAGNOSIS — Q667 Congenital pes cavus, unspecified foot: Secondary | ICD-10-CM

## 2024-02-03 NOTE — Telephone Encounter (Signed)
 Please review and advise.   JM

## 2024-02-03 NOTE — Telephone Encounter (Signed)
 Sent message via [facility] in regards to injections.  JM

## 2024-02-03 NOTE — Telephone Encounter (Signed)
   Copied from CRM [id number]. Topic: Clinical - Request for Lab/Test Order >> Feb 03, 2024 10:36 AM Ivette P wrote: Reason for CRM: Pt called in about injections, pt has not heard anything and is wondering if he has to do anything on his end, or if the injections are going to continue.   Pls reach out to pt.

## 2024-02-03 NOTE — Telephone Encounter (Signed)
 Copied from CRM #[id number]. Topic: Clinical - Medication Question >> Feb 03, 2024  2:48 PM Larissa S wrote: Reason for CRM: Patient requesting a callback from DR. Alvia regarding medication, Monovisc.

## 2024-02-03 NOTE — Progress Notes (Signed)
 Subjective:  Patient ID: Roberto Holder, male    DOB: 1960-10-13,  MRN: [id number]  Chief Complaint  Patient presents with   Foot Pain    Pt stated that he is having issues with his knee and was told he has high arches     63 y.o. male presents with the above complaint.  Patient presents with complaint of high arches.  Patient states causing him some knee discomfort especially on the left side.  Wanted to get it evaluated.  He does not currently wear any orthotics he would like to discuss on getting some orthotics.  He does not have any foot and ankle issues at this time.  He does relate to high arches and sometimes some arch discomfort.  Pain scale is 1 out of 10 dull aching nature he is having antalgic gait leading to knee issues   Review of Systems: Negative except as noted in the HPI. Denies N/V/F/Ch.  Past Medical History:  Diagnosis Date   COVID-19 06/20/2021   ED (erectile dysfunction)    Hyperlipidemia    Hypertension 05/24   Medical history non-contributory     Current Outpatient Medications:    aspirin  EC 81 MG tablet, Take 1 tablet (81 mg total) by mouth daily. Swallow whole. (Patient taking differently: Take 81 mg by mouth as needed. Swallow whole.), Disp: 30 tablet, Rfl: 1   B Complex Vitamins (VITAMIN B-COMPLEX PO), Take by mouth., Disp: , Rfl:    MAGNESIUM PO, Take by mouth 2 (two) times a week., Disp: , Rfl:    meloxicam  (MOBIC ) 15 MG tablet, Take 1 tablet (15 mg total) by mouth daily., Disp: 30 tablet, Rfl: 0   metFORMIN  (GLUCOPHAGE ) 500 MG tablet, Take 1 tablet (500 mg total) by mouth daily with breakfast for 7 days, THEN 1 tablet (500 mg total) 2 (two) times daily with a meal., Disp: 90 tablet, Rfl: 1   montelukast  (SINGULAIR ) 10 MG tablet, Take 1 tablet (10 mg total) by mouth at bedtime., Disp: 30 tablet, Rfl: 3   predniSONE  (DELTASONE ) 10 MG tablet, Take 6 tabs p.o. on day 1 and decrease by 1 tab daily until complete, Disp: 21 tablet, Rfl: 0   Sodium Hyaluronate,  Viscosup, (EUFLEXXA) 20 MG/2ML SOSY, Inject 20 mg into the articular space once a week. x3 weeks.  Dx Left Knee osteoarthritis. (Patient not taking: Reported on 01/23/2024), Disp: 2 mL, Rfl: 0   Sodium Hyaluronate, Viscosup, (EUFLEXXA) 20 MG/2ML SOSY, Inject 20 mg into the articular space once a week. x3 weeks.  Dx Knee osteoarthritis, patient will bring syringes to me and I will inject, Disp: 6 mL, Rfl: 0   tadalafil  (CIALIS ) 10 MG tablet, Take 1 tablet (10 mg total) by mouth daily as needed for erectile dysfunction., Disp: 20 tablet, Rfl: 2  Social History   Tobacco Use  Smoking Status Never  Smokeless Tobacco Never    Allergies  Allergen Reactions   Alendronate     Other reaction(s): Other (See Comments) Raw throat, bumps inside mouth.   Other Rash    TAPE   Tape Rash    Paper tape ok   Objective:  There were no vitals filed for this visit. There is no height or weight on file to calculate BMI. Constitutional Well developed. Well nourished.  Vascular Dorsalis pedis pulses palpable bilaterally. Posterior tibial pulses palpable bilaterally. Capillary refill normal to all digits.  No cyanosis or clubbing noted. Pedal hair growth normal.  Neurologic Normal speech. Oriented to person, place,  and time. Epicritic sensation to light touch grossly present bilaterally.  Dermatologic Nails well groomed and normal in appearance. No open wounds. No skin lesions.  Orthopedic: Pes cavus foot structure noted with cavovarus foot type.  Nonreducible cavus deformity noted.  No neurological fair manifestation noted.  No prominent spot noted.  Plantarflexed first ray noted   Radiographs: None Assessment:   1. Pes cavus    Plan:  Patient was evaluated and treated and all questions answered.  Pes cavus likely leading to antalgic gait -I explained to patient the etiology of pes cavus and relationship with Planter fasciitis/arch pain and various treatment options were discussed.  Given  patient foot structure in the setting of Planter fasciitis I believe patient will benefit from custom-made orthotics to help control the hindfoot motion support the arch of the foot and take the stress away from plantar fascial.  Patient agrees with the plan like to proceed with orthotics -Patient was casted for orthotics   Return in about 5 weeks (around 03/09/2024) for orthotic pick up .

## 2024-02-06 ENCOUNTER — Encounter: Payer: Self-pay | Admitting: Family Medicine

## 2024-02-06 ENCOUNTER — Other Ambulatory Visit: Payer: Self-pay

## 2024-02-06 DIAGNOSIS — M1712 Unilateral primary osteoarthritis, left knee: Secondary | ICD-10-CM

## 2024-02-06 MED ORDER — EUFLEXXA 20 MG/2ML IX SOSY
20.0000 mg | PREFILLED_SYRINGE | INTRA_ARTICULAR | 0 refills | Status: DC
Start: 2024-02-06 — End: 2024-02-25

## 2024-02-06 NOTE — Telephone Encounter (Signed)
 [facility] for Euf[facility]xa [facility]. May need PA. Once we receive PA form I will submit to PA team. Spoke with patient and informed him of this as well. He verbalized understanding.  JM

## 2024-02-13 ENCOUNTER — Telehealth: Payer: Self-pay

## 2024-02-13 ENCOUNTER — Encounter: Payer: Self-pay | Admitting: Internal Medicine

## 2024-02-13 ENCOUNTER — Ambulatory Visit (INDEPENDENT_AMBULATORY_CARE_PROVIDER_SITE_OTHER): Admitting: Internal Medicine

## 2024-02-13 VITALS — BP 128/82 | HR 104 | Temp 99.0°F | Ht 68.5 in | Wt 210.5 lb

## 2024-02-13 DIAGNOSIS — R051 Acute cough: Secondary | ICD-10-CM

## 2024-02-13 DIAGNOSIS — J0101 Acute recurrent maxillary sinusitis: Secondary | ICD-10-CM

## 2024-02-13 DIAGNOSIS — L739 Follicular disorder, unspecified: Secondary | ICD-10-CM

## 2024-02-13 LAB — POC COVID19 BINAXNOW: SARS Coronavirus 2 Ag: NEGATIVE

## 2024-02-13 MED ORDER — LEVOFLOXACIN 500 MG PO TABS: 500.0000 mg | ORAL_TABLET | Freq: Every day | ORAL | 0 refills | Status: AC

## 2024-02-13 MED ORDER — ALBUTEROL SULFATE HFA 108 (90 BASE) MCG/ACT IN AERS: 2.0000 | INHALATION_SPRAY | Freq: Four times a day (QID) | RESPIRATORY_TRACT | 0 refills | Status: DC | PRN

## 2024-02-13 NOTE — Progress Notes (Signed)
 Date:  02/13/2024   Name:  Roberto Holder   DOB:  1961-02-05   MRN:  [id number]   Chief Complaint: Sinusitis (Patient is out of breath just walking short distances, feeling his face is flashed, red and warm. Went to the urgent for the same issue was giving medications, it cleared up but now is feeling worse than before )  Sinus Problem This is a recurrent problem. The problem has been gradually worsening since onset. The pain is mild. Associated symptoms include chills, congestion, coughing, shortness of breath and sinus pressure. Pertinent negatives include no ear pain, headaches or sore throat.  Rash This is a new problem. The current episode started 1 to 4 weeks ago. The problem has been gradually improving since onset. The affected locations include the head and neck. Associated symptoms include congestion, coughing, fatigue, a fever and shortness of breath. Pertinent negatives include no diarrhea, sore throat or vomiting. Past treatments include antibiotics and oral steroids. The treatment provided moderate relief.    Review of Systems  Constitutional:  Positive for chills, fatigue and fever.  HENT:  Positive for congestion and sinus pressure. Negative for ear pain, postnasal drip and sore throat.   Respiratory:  Positive for cough, chest tightness and shortness of breath. Negative for wheezing.   Cardiovascular:  Negative for chest pain and palpitations.  Gastrointestinal:  Negative for constipation, diarrhea and vomiting.  Musculoskeletal:  Negative for arthralgias and myalgias.  Skin:  Positive for rash.  Neurological:  Negative for dizziness, light-headedness and headaches.  Psychiatric/Behavioral:  Positive for sleep disturbance. Negative for dysphoric mood. The patient is not nervous/anxious.      Lab Results  Component Value Date   NA 134 (L) 02/17/2023   K 4.2 02/17/2023   CO2 23 02/17/2023   GLUCOSE 115 (H) 02/17/2023   BUN 16 02/17/2023   CREATININE 0.94 02/17/2023    CALCIUM  8.9 02/17/2023   EGFR >90 06/07/2022   GFRNONAA >60 02/17/2023   Lab Results  Component Value Date   CHOL 178 02/17/2023   HDL 45 02/17/2023   LDLCALC 102 (H) 02/17/2023   TRIG 154 (H) 02/17/2023   CHOLHDL 4.0 02/17/2023   Lab Results  Component Value Date   TSH 1.69 06/07/2022   Lab Results  Component Value Date   HGBA1C 5.9 (H) 02/17/2023   Lab Results  Component Value Date   WBC 7.3 10/17/2022   HGB 15.7 10/17/2022   HCT 46.7 10/17/2022   MCV 85.4 10/17/2022   PLT 230 10/17/2022   Lab Results  Component Value Date   ALT 21 02/17/2023   AST 21 02/17/2023   ALKPHOS 49 02/17/2023   BILITOT 1.4 (H) 02/17/2023   Lab Results  Component Value Date   VD25OH 43.92 02/17/2023     Patient Active Problem List   Diagnosis Date Noted   Persistent cough for 3 weeks or longer 01/23/2024   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   Other Rash    TAPE   Tape  Rash    Paper tape ok    Past Surgical History:  Procedure Laterality Date   CHOLECYSTECTOMY     COLONOSCOPY WITH PROPOFOL  N/A 08/24/2021   Procedure: COLONOSCOPY WITH PROPOFOL ;  Surgeon: Jinny Carmine, MD;  Location: [facility];  Service: Endoscopy;  Laterality: N/A;   GANGLION CYST EXCISION Right    wrist   KNEE ARTHROTOMY Left 01/27/2018   Procedure: KNEE ARTHROTOMY MEDIAL MENISOUS ROOT REPAIR;  Surgeon: Tobie Priest, MD;  Location: [facility];  Service: Orthopedics;  Laterality: Left;   POLYPECTOMY   08/24/2021   Procedure: POLYPECTOMY;  Surgeon: Jinny Carmine, MD;  Location: [facility];  Service: Endoscopy;;    Social History   Tobacco Use   Smoking status: Never   Smokeless tobacco: Never  Vaping Use   Vaping status: Never Used  Substance Use Topics   Alcohol use: Yes    Alcohol/week: 2.0 standard drinks of alcohol    Types: 2 Cans of beer per week    Comment: socially   Drug use: No     Medication list has been reviewed and updated.  Current Meds  Medication Sig   albuterol  (VENTOLIN  HFA) 108 (90 Base) MCG/ACT inhaler Inhale 2 puffs into the lungs every 6 (six) hours as needed for wheezing or shortness of breath.   aspirin  EC 81 MG tablet Take 1 tablet (81 mg total) by mouth daily. Swallow whole. (Patient taking differently: Take 81 mg by mouth as needed. Swallow whole.)   B Complex Vitamins (VITAMIN B-COMPLEX PO) Take by mouth.   levofloxacin  (LEVAQUIN ) 500 MG tablet Take 1 tablet (500 mg total) by mouth daily for 5 days.   MAGNESIUM PO Take by mouth 2 (two) times a week.   meloxicam  (MOBIC ) 15 MG tablet Take 1 tablet (15 mg total) by mouth daily.   metFORMIN  (GLUCOPHAGE ) 500 MG tablet Take 1 tablet (500 mg total) by mouth daily with breakfast for 7 days, THEN 1 tablet (500 mg total) 2 (two) times daily with a meal.   montelukast  (SINGULAIR ) 10 MG tablet Take 1 tablet (10 mg total) by mouth at bedtime.   Sodium Hyaluronate, Viscosup, (EUFLEXXA) 20 MG/2ML SOSY Inject 20 mg into the articular space once a week. x3 weeks.  Dx Left Knee osteoarthritis.   Sodium Hyaluronate, Viscosup, (EUFLEXXA) 20 MG/2ML SOSY Inject 20 mg into the articular space once a week. x3 weeks.  Dx Knee osteoarthritis, patient will bring syringes to me and I will inject   tadalafil  (CIALIS ) 10 MG tablet Take 1 tablet (10 mg total) by mouth daily as needed for erectile dysfunction.       01/23/2024    2:43 PM 09/15/2023    8:39 AM 03/17/2023   10:27 AM 02/14/2023    4:14 PM  GAD 7 :  Generalized Anxiety Score  Nervous, Anxious, on Edge 0 0 0 0  Control/stop worrying 0 0 0 0  Worry too much - different things 0 0 0 0  Trouble relaxing 0 0 0 0  Restless 0 0 0 0  Easily annoyed or irritable 0 0 0 0  Afraid - awful might happen 0 0 0 0  Total GAD 7 Score 0 0 0 0  Anxiety Difficulty Not difficult at all Not difficult at all Not difficult at all Not difficult at all       01/23/2024    2:43 PM 09/15/2023    8:39 AM 03/17/2023   10:27 AM  Depression screen [facility] 2/9  Decreased Interest 0 0 0  Down, Depressed, Hopeless 0 0 0  PHQ - 2 Score 0 0 0  Altered sleeping   0  Tired, decreased energy   0  Change in appetite   0  Feeling bad or failure about yourself    0  Trouble concentrating   0  Moving slowly or fidgety/restless   0  Suicidal thoughts   0  PHQ-9 Score   0  Difficult doing work/chores   Not difficult at all    BP Readings from Last 3 Encounters:  02/13/24 128/82  01/26/24 (!) 145/90  01/23/24 118/76    Physical Exam Vitals and nursing note reviewed.  Constitutional:      General: He is not in acute distress.    Appearance: He is well-developed. He is not ill-appearing.  HENT:     Head: Normocephalic and atraumatic.     Right Ear: Tympanic membrane normal.     Left Ear: Tympanic membrane normal.     Nose:     Right Sinus: Maxillary sinus tenderness present.     Left Sinus: Maxillary sinus tenderness present.  Cardiovascular:     Rate and Rhythm: Normal rate and regular rhythm.  Pulmonary:     Effort: Pulmonary effort is normal. No respiratory distress.     Breath sounds: Normal breath sounds. No decreased air movement. No decreased breath sounds or wheezing.  Musculoskeletal:     Cervical back: Normal range of motion.  Lymphadenopathy:     Cervical: No cervical adenopathy.  Skin:    General: Skin is warm and dry.     Findings: Erythema and rash (scattered pustules on upper back; redness of both cheeks) present.  Neurological:     Mental  Status: He is alert and oriented to person, place, and time.  Psychiatric:        Mood and Affect: Mood normal.        Behavior: Behavior normal.     Wt Readings from Last 3 Encounters:  02/13/24 210 lb 8 oz (95.5 kg)  01/23/24 208 lb (94.3 kg)  01/20/24 206 lb 11.2 oz (93.8 kg)    BP 128/82   Pulse (!) 104   Temp 99 F (37.2 C) (Oral)   Ht 5' 8.5 (1.74 m)   Wt 210 lb 8 oz (95.5 kg)   SpO2 96%   BMI 31.54 kg/m   Assessment and Plan:  Problem List Items Addressed This Visit   None Visit Diagnoses       Acute recurrent maxillary sinusitis    -  Primary   possible resistant sinus infection despite Doxy recently. will treat with Levofloxin, albuterol  MDI prn Tylenol , fluids and rest   Relevant Medications   doxycycline  (PERIOSTAT ) 20 MG tablet   levofloxacin  (LEVAQUIN ) 500 MG tablet     Acute cough       bronchospasm from PND use albuterol  as needed   Relevant Medications   albuterol  (VENTOLIN  HFA) 108 (90 Base) MCG/ACT inhaler   Other Relevant Orders   POC COVID-19 BinaxNow (Completed)     Folliculitis       finished Doxy with some persistent inflammation and pustules see Dermatology as planned next week       No follow-ups on file.    Leita HILARIO Adie, MD [facility] [place]

## 2024-02-13 NOTE — Telephone Encounter (Signed)
 Copied from CRM #[id number]. Topic: Clinical - Medical Advice >> Feb 13, 2024 12:04 PM Delon DASEN wrote: Reason for CRM: Just got home from appt and now has bad diarrhea, please [phone]

## 2024-02-13 NOTE — Telephone Encounter (Signed)
 Provider, [place], informed.

## 2024-02-16 ENCOUNTER — Ambulatory Visit: Admitting: Dermatology

## 2024-02-16 ENCOUNTER — Telehealth: Payer: Self-pay | Admitting: Physician Assistant

## 2024-02-16 DIAGNOSIS — D1722 Benign lipomatous neoplasm of skin and subcutaneous tissue of left arm: Secondary | ICD-10-CM

## 2024-02-16 DIAGNOSIS — W908XXA Exposure to other nonionizing radiation, initial encounter: Secondary | ICD-10-CM | POA: Diagnosis not present

## 2024-02-16 DIAGNOSIS — L719 Rosacea, unspecified: Secondary | ICD-10-CM

## 2024-02-16 DIAGNOSIS — Z1283 Encounter for screening for malignant neoplasm of skin: Secondary | ICD-10-CM

## 2024-02-16 DIAGNOSIS — L739 Follicular disorder, unspecified: Secondary | ICD-10-CM

## 2024-02-16 DIAGNOSIS — L578 Other skin changes due to chronic exposure to nonionizing radiation: Secondary | ICD-10-CM | POA: Diagnosis not present

## 2024-02-16 DIAGNOSIS — Z79899 Other long term (current) drug therapy: Secondary | ICD-10-CM

## 2024-02-16 DIAGNOSIS — L821 Other seborrheic keratosis: Secondary | ICD-10-CM

## 2024-02-16 DIAGNOSIS — L814 Other melanin hyperpigmentation: Secondary | ICD-10-CM

## 2024-02-16 DIAGNOSIS — L711 Rhinophyma: Secondary | ICD-10-CM

## 2024-02-16 DIAGNOSIS — D229 Melanocytic nevi, unspecified: Secondary | ICD-10-CM

## 2024-02-16 DIAGNOSIS — Z7189 Other specified counseling: Secondary | ICD-10-CM

## 2024-02-16 DIAGNOSIS — B86 Scabies: Secondary | ICD-10-CM

## 2024-02-16 MED ORDER — DOXYCYCLINE 40 MG PO CPDR
40.0000 mg | DELAYED_RELEASE_CAPSULE | ORAL | 11 refills | Status: DC
Start: 2024-02-16 — End: 2024-03-17

## 2024-02-16 NOTE — Patient Instructions (Addendum)
 If not scab at right back of neck is not resolved in 6 weeks call or send [facility] metronidazole/ivermectin/azelaic acid twice daily affected areas on the face for 1 month if improving can continue using nightly   The patient was advised this is not covered by insurance since it is made by a compounding pharmacy. They will receive an email to check out and the medication will be mailed to their home.   Instructions for [facility] Medications  One or more of your medications was sent to the [facility] mail order compounding pharmacy. You will receive an email from them and can purchase the medicine through that link. It will then be mailed to your home at the address you confirmed. If for any reason you do not receive an email from them, please check your spam folder. If you still do not find the email, please let us  know. [facility] phone number is [phone].    Rosacea  What is rosacea? Rosacea (say: ro-zay-sha) is a common skin disease that usually begins as a trend of flushing or blushing easily.  As rosacea progresses, a persistent redness in the center of the face will develop and may gradually spread beyond the nose and cheeks to the forehead and chin.  In some cases, the ears, chest, and back could be affected.  Rosacea may appear as tiny blood vessels or small red bumps that occur in crops.  Frequently they can contain pus, and are called "pustules".  If the bumps do not contain pus, they are referred to as "papules".  Rarely, in prolonged, untreated cases of rosacea, the oil glands of the nose and cheeks may become permanently enlarged.  This is called rhinophyma, and is seen more frequently in men.  Signs and Risks In its beginning stages, rosacea tends to come and go, which makes it difficult to recognize.  It can start as intermittent flushing of the face.  Eventually, blood vessels may become permanently visible.  Pustules and papules can  appear, but can be mistaken for adult acne.  People of all races, ages, genders and ethnic groups are at risk of developing rosacea.  However, it is more common in women (especially around menopause) and adults with fair skin between the ages of 10 and 65.  Treatment Dermatologists typically recommend a combination of treatments to effectively manage rosacea.  Treatment can improve symptoms and may stop the progression of the rosacea.  Treatment may involve both topical and oral medications.  The tetracycline antibiotics are often used for their anti-inflammatory effect; however, because of the possibility of developing antibiotic resistance, they should not be used long term at full dose.  For dilated blood vessels the options include electrodessication (uses electric current through a small needle), laser treatment, and cosmetics to hide the redness.   With all forms of treatment, improvement is a slow process, and patients may not see any results for the first 3-4 weeks.  It is very important to avoid the sun and other triggers.  Patients must wear sunscreen daily.  Skin Care Instructions: Cleanse the skin with a mild soap such as CeraVe cleanser, Cetaphil cleanser, or Dove soap once or twice daily as needed. Moisturize with Eucerin Redness Relief Daily Perfecting Lotion (has a subtle green tint), CeraVe Moisturizing Cream, or Oil of Olay Daily Moisturizer with sunscreen every morning and/or night as recommended. Makeup should be "non-comedogenic" (won't clog pores) and be labeled "for sensitive skin". Good choices for  cosmetics are: Neutrogena, Almay, and Physician's Formula.  Any product with a green tint tends to offset a red complexion. If your eyes are dry and irritated, use artificial tears 2-3 times per day and cleanse the eyelids daily with baby shampoo.  Have your eyes examined at least every 2 years.  Be sure to tell your eye doctor that you have rosacea. Alcoholic beverages tend to cause  flushing of the skin, and may make rosacea worse. Always wear sunscreen, protect your skin from extreme hot and cold temperatures, and avoid spicy foods, hot drinks, and mechanical irritation such as rubbing, scrubbing, or massaging the face.  Avoid harsh skin cleansers, cleansing masks, astringents, and exfoliation. If a particular product burns or makes your face feel tight, then it is likely to flare your rosacea. If you are having difficulty finding a sunscreen that you can tolerate, you may try switching to a chemical-free sunscreen.  These are ones whose active ingredient is zinc oxide or titanium dioxide only.  They should also be fragrance free, non-comedogenic, and labeled for sensitive skin. Rosacea triggers may vary from person to person.  There are a variety of foods that have been reported to trigger rosacea.  Some patients find that keeping a diary of what they were doing when they flared helps them avoid triggers.    Folliculitis occurs due to inflammation of the superficial hair follicle (pore), resulting in acne-like lesions (pus bumps). It can be infectious (bacterial, fungal) or noninfectious (shaving, tight clothing, heat/sweat, medications).  Folliculitis can be acute or chronic and recommended treatment depends on the underlying cause of folliculitis.  Start oracea  40 mg tab - take 1 by mouth daily with food and drink  Doxycycline  should be taken with food to prevent nausea. Do not lay down for 30 minutes after taking. Be cautious with sun exposure and use good sun protection while on this medication. Pregnant women should not take this medication.     Melanoma ABCDEs  Melanoma is the most dangerous type of skin cancer, and is the leading cause of death from skin disease.  You are more likely to develop melanoma if you: Have light-colored skin, light-colored eyes, or red or blond hair Spend a lot of time in the sun Tan regularly, either outdoors or in a tanning bed Have had  blistering sunburns, especially during childhood Have a close family member who has had a melanoma Have atypical moles or large birthmarks  Early detection of melanoma is key since treatment is typically straightforward and cure rates are extremely high if we catch it early.   The first sign of melanoma is often a change in a mole or a new dark spot.  The ABCDE system is a way of remembering the signs of melanoma.  A for asymmetry:  The two halves do not match. B for border:  The edges of the growth are irregular. C for color:  A mixture of colors are present instead of an even brown color. D for diameter:  Melanomas are usually (but not always) greater than 6mm - the size of a pencil eraser. E for evolution:  The spot keeps changing in size, shape, and color.  Please check your skin once per month between visits. You can use a small mirror in front and a large mirror behind you to keep an eye on the back side or your body.   If you see any new or changing lesions before your next follow-up, please call to schedule a visit.  Please continue daily skin protection including broad spectrum sunscreen SPF 30+ to sun-exposed areas, reapplying every 2 hours as needed when you're outdoors.   Staying in the shade or wearing long sleeves, sun glasses (UVA+UVB protection) and wide brim hats (4-inch brim around the entire circumference of the hat) are also recommended for sun protection.    Due to recent changes in healthcare laws, you may see results of your pathology and/or laboratory studies on MyChart before the doctors have had a chance to review them. We understand that in some cases there may be results that are confusing or concerning to you. Please understand that not all results are received at the same time and often the doctors may need to interpret multiple results in order to provide you with the best plan of care or course of treatment. Therefore, we ask that you please give us  2 business  days to thoroughly review all your results before contacting the office for clarification. Should we see a critical lab result, you will be contacted sooner.   If You Need Anything After Your Visit  If you have any questions or concerns for your doctor, please call our main line at [phone] and press option 4 to reach your doctor's medical assistant. If no one answers, please leave a voicemail as directed and we will return your call as soon as possible. Messages left after 4 pm will be answered the following business day.   You may also send us  a message via MyChart. We typically respond to MyChart messages within 1-2 business days.  For prescription refills, please ask your pharmacy to contact our office. Our fax number is [phone].  If you have an urgent issue when the clinic is closed that cannot wait until the next business day, you can page your doctor at the number below.    Please note that while we do our best to be available for urgent issues outside of office hours, we are not available 24/7.   If you have an urgent issue and are unable to reach us , you may choose to seek medical care at your doctor's office, retail clinic, urgent care center, or emergency room.  If you have a medical emergency, please immediately call 911 or go to the emergency department.  Pager Numbers  - Dr. Hester: [phone]  - Dr. Jackquline: [phone]  - Dr. Claudene: [phone]   In the event of inclement weather, please call our main line at [phone] for an update on the status of any delays or closures.  Dermatology Medication Tips: Please keep the boxes that topical medications come in in order to help keep track of the instructions about where and how to use these. Pharmacies typically print the medication instructions only on the boxes and not directly on the medication tubes.   If your medication is too expensive, please contact our office at [phone] option 4 or send us  a  message through MyChart.   We are unable to tell what your co-pay for medications will be in advance as this is different depending on your insurance coverage. However, we may be able to find a substitute medication at lower cost or fill out paperwork to get insurance to cover a needed medication.   If a prior authorization is required to get your medication covered by your insurance company, please allow us  1-2 business days to complete this process.  Drug prices often vary depending on where the prescription is filled and some pharmacies may offer cheaper prices.  The website www.goodrx.com contains coupons for medications through different pharmacies. The prices here do not account for what the cost may be with help from insurance (it may be cheaper with your insurance), but the website can give you the price if you did not use any insurance.  - You can print the associated coupon and take it with your prescription to the pharmacy.  - You may also stop by our office during regular business hours and pick up a GoodRx coupon card.  - If you need your prescription sent electronically to a different pharmacy, notify our office through [facility] or by phone at [phone] option 4.     Si Usted Necesita Algo Despus de Su Visita  Tambin puede enviarnos un mensaje a travs de Clinical cytogeneticist. Por lo general respondemos a los mensajes de MyChart en el transcurso de 1 a 2 das hbiles.  Para renovar recetas, por favor pida a su farmacia que se ponga en contacto con nuestra oficina. Randi lakes de fax es [place] [phone].  Si tiene un asunto urgente cuando la clnica est cerrada y que no puede esperar hasta el siguiente da hbil, puede llamar/localizar a su doctor(a) al nmero que aparece a continuacin.   Por favor, tenga en cuenta que aunque hacemos todo lo posible para estar disponibles para asuntos urgentes fuera del horario de [place], no estamos disponibles las 24 horas del da, los 7  [address]  [address] de la [place].   Si tiene un problema urgente y no puede comunicarse con nosotros, puede optar por buscar atencin mdica  en el consultorio de su doctor(a), en una clnica privada, en un centro de atencin urgente o en una sala de emergencias.  Si tiene Engineer, drilling, por favor llame inmediatamente al 911 o vaya a la sala de emergencias.  Nmeros de bper  - Dr. Hester: [phone]  - Dra. Jackquline: [phone]  - Dr. Claudene: [phone]   En caso de inclemencias del tiempo, por favor llame a landry capes principal al [phone] para una actualizacin sobre el [place] de cualquier retraso o cierre.  Consejos para la medicacin en dermatologa: Por favor, guarde las cajas en las que vienen los medicamentos de uso tpico para ayudarle a seguir las instrucciones sobre dnde y cmo usarlos. Las farmacias generalmente imprimen las instrucciones del medicamento slo en las cajas y no directamente en los tubos del [place].   Si su medicamento es muy caro, por favor, pngase en contacto con landry rieger llamando al [phone] y presione la opcin 4 o envenos un mensaje a travs de Clinical cytogeneticist.   No podemos decirle cul ser su copago por los medicamentos por adelantado ya que esto es diferente dependiendo de la cobertura de su seguro. Sin embargo, es posible que podamos encontrar un medicamento sustituto a Audiological scientist un formulario para que el seguro cubra el medicamento que se considera necesario.   Si se requiere una autorizacin previa para que su compaa de seguros malta su medicamento, por favor permtanos de 1 a 2 das hbiles para completar este proceso.  Los precios de los medicamentos varan con frecuencia dependiendo del Environmental consultant de dnde se surte la receta y alguna farmacias pueden ofrecer precios ms baratos.  El sitio web www.goodrx.com tiene cupones para medicamentos de Health and safety inspector. Los precios aqu no tienen en cuenta lo que podra costar con  la ayuda del seguro (puede ser ms barato con su seguro), pero el sitio web puede darle el precio si no utiliz Tourist information centre manager.  -  Puede imprimir el cupn correspondiente y llevarlo con su receta a la farmacia.  - Tambin puede pasar por nuestra oficina durante el horario de atencin regular y Education officer, museum una tarjeta de cupones de GoodRx.  - Si necesita que su receta se enve electrnicamente a una farmacia diferente, informe a nuestra oficina a travs de MyChart de [place] o por telfono llamando al [phone] y presione la opcin 4.

## 2024-02-16 NOTE — Telephone Encounter (Signed)
 Returned call to discuss his symptoms for which his was seen by my colleague 02/13/24. He has not yet taken the prescribed antibiotic due to onset of diarrhea after his visit here. Given the constellation of symptoms, probably viral illness. Okay to hold off on using the levofloxacin  for now, but advised to take it if his sinus symptoms regress. Okay to return to work tomorrow, attaching work note.

## 2024-02-16 NOTE — Progress Notes (Unsigned)
 Follow-Up Visit   Subjective  Roberto Holder is a 63 y.o. male who presents for the following: Skin Cancer Screening and Full Body Skin Exam. Patient reports some bumps he developed on face, neck, chest, back of neck a couple of weeks ago. Was treated by primary doctor and given prednisone  taper and doxycycline . Would like to discuss further treatment states have returned.    The patient presents for Total-Body Skin Exam (TBSE) for skin cancer screening and mole check. The patient has spots, moles and lesions to be evaluated, some may be new or changing and the patient may have concern these could be cancer.    The following portions of the chart were reviewed this encounter and updated as appropriate: medications, allergies, medical history  Review of Systems:  No other skin or systemic complaints except as noted in HPI or Assessment and Plan.  Objective  Well appearing patient in no apparent distress; mood and affect are within normal limits.  A full examination was performed including scalp, head, eyes, ears, nose, lips, neck, chest, axillae, abdomen, back, buttocks, bilateral upper extremities, bilateral lower extremities, hands, feet, fingers, toes, fingernails, and toenails. All findings within normal limits unless otherwise noted below.   Relevant physical exam findings are noted in the Assessment and Plan. Scab at right posterior neck  Scab at right posterior neck    Folliculitis at chest  Folliculitis and rosacea at face  Folliculitis at posterior neck  Folliculitis at posterior neck  Folliculitis at back  Folliculitis at back     Assessment & Plan   SKIN CANCER SCREENING PERFORMED TODAY.  ACTINIC DAMAGE - Chronic condition, secondary to cumulative UV/sun exposure - diffuse scaly erythematous macules with underlying dyspigmentation - Recommend daily broad spectrum sunscreen SPF 30+ to sun-exposed areas, reapply every 2 hours as needed.  - Staying in the  shade or wearing long sleeves, sun glasses (UVA+UVB protection) and wide brim hats (4-inch brim around the entire circumference of the hat) are also recommended for sun protection.  - Call for new or changing lesions.  LENTIGINES, SEBORRHEIC KERATOSES, HEMANGIOMAS - Benign normal skin lesions - Benign-appearing - Call for any changes  MELANOCYTIC NEVI - Tan-brown and/or pink-flesh-colored symmetric macules and papules - Benign appearing on exam today - Observation - Call clinic for new or changing moles - Recommend daily use of broad spectrum spf 30+ sunscreen to sun-exposed areas.   Scab at right posterior neck Exam: see photo   Treatment Plan: Patient advised to call or send mychart in 6 weeks if not resolved. 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 Condition is bothersome/symptomatic for patient. Currently flared.   Rosacea is a chronic progressive skin condition usually affecting the face of adults, causing redness and/or acne bumps. It is treatable but not curable. It sometimes affects the eyes (ocular rosacea) as well. It may respond to topical and/or systemic medication and can flare with stress, sun exposure, alcohol, exercise, topical steroids (including hydrocortisone/cortisone 10) and some foods.  Daily application of broad spectrum spf 30+ sunscreen to face is recommended to reduce flares.   Patient c/o grittiness of the eyes    Discussed different types of folliculitis  Bacterial assocated with rosacea and sterile folliculitis    Treatment Plan  Start twice daily  for a month if improving can use once daily at bedtime Will prescribe [facility] metronidazole/ivermectin/azelaic acid twice daily as needed to affected areas on the face. The  patient was advised this is not covered by insurance since it is made by a compounding pharmacy. They will receive an email to check out and the medication will be mailed to their home.   Mild ocular rosacea and rhinophyma Start oracea  40 mg po qd with evening meal   Doxycycline  should be taken with food to prevent nausea. Do not lay down for 30 minutes after taking. Be cautious with sun exposure and use good sun protection while on this medication. Pregnant women should not take this medication.  FOLLICULITIS Exam: Perifollicular erythematous papules and pustules At neck and shoulders   Folliculitis occurs due to inflammation of the superficial hair follicle (pore), resulting in acne-like lesions (pus bumps). It can be infectious (bacterial, fungal) or noninfectious (shaving, tight clothing, heat/sweat, medications).  Folliculitis can be acute or chronic and recommended treatment depends on the underlying cause of folliculitis.  Treatment Plan:  Low dose of doxycycline   40 mg po qd with food and drink If not covered will send doxy 20 take 2 or 50 mg    Lipoma  Exam:  7 x 5 cm Subcutaneous rubbery nodule Location: left deltoid    Benign-appearing. Exam most consistent with a lipoma. Discussed that a lipoma is a benign fatty growth that can grow over time and sometimes get irritated. Recommend observation if it is not bothersome or changing. Discussed option of ILK injections or surgical excision to remove it if it is growing, symptomatic, or other changes noted. Please call for new or changing lesions so they can be evaluated.     No follow-ups on file.  IEleanor Blush, CMA, am acting as scribe for Alm Rhyme, MD.   Documentation: I have reviewed the above documentation for accuracy and completeness, and I agree with the above.  Alm Rhyme, MD

## 2024-02-16 NOTE — Telephone Encounter (Signed)
 Called pt he still does not feel well. Diarrhea has went away. Fever is gone but still feels weak. Told pt to start the medication that was prescribed. Pt stated he needed a note out for work due to him not going in today.  KP

## 2024-02-17 ENCOUNTER — Encounter: Payer: Self-pay | Admitting: Dermatology

## 2024-02-25 ENCOUNTER — Telehealth: Payer: Self-pay

## 2024-02-25 ENCOUNTER — Other Ambulatory Visit: Payer: Self-pay

## 2024-02-25 DIAGNOSIS — M1712 Unilateral primary osteoarthritis, left knee: Secondary | ICD-10-CM

## 2024-02-25 MED ORDER — EUFLEXXA 20 MG/2ML IX SOSY: 20.0000 mg | PREFILLED_SYRINGE | INTRA_ARTICULAR | 0 refills | Status: DC

## 2024-02-25 NOTE — Telephone Encounter (Signed)
 ORTHOTICS IN [place] [facility]

## 2024-03-05 ENCOUNTER — Ambulatory Visit: Admitting: Student in an Organized Health Care Education/Training Program

## 2024-03-09 ENCOUNTER — Ambulatory Visit (INDEPENDENT_AMBULATORY_CARE_PROVIDER_SITE_OTHER): Admitting: Podiatry

## 2024-03-09 DIAGNOSIS — Q667 Congenital pes cavus, unspecified foot: Secondary | ICD-10-CM

## 2024-03-09 NOTE — Progress Notes (Signed)
 Orthotics were dispensed they are functioning well no acute complaints.  Break-in period was discussed

## 2024-03-12 NOTE — Telephone Encounter (Signed)
 Spoke with patient and he is going to call insurance and see what other specialty pharmacy he can go through to see if it is cheaper.  JM

## 2024-03-17 ENCOUNTER — Ambulatory Visit (INDEPENDENT_AMBULATORY_CARE_PROVIDER_SITE_OTHER): Admitting: Physician Assistant

## 2024-03-17 ENCOUNTER — Encounter: Payer: Self-pay | Admitting: Physician Assistant

## 2024-03-17 VITALS — BP 122/74 | HR 74 | Ht 68.5 in | Wt 210.1 lb

## 2024-03-17 DIAGNOSIS — E785 Hyperlipidemia, unspecified: Secondary | ICD-10-CM

## 2024-03-17 DIAGNOSIS — Z0000 Encounter for general adult medical examination without abnormal findings: Secondary | ICD-10-CM

## 2024-03-17 DIAGNOSIS — Z23 Encounter for immunization: Secondary | ICD-10-CM

## 2024-03-17 DIAGNOSIS — R7303 Prediabetes: Secondary | ICD-10-CM | POA: Diagnosis not present

## 2024-03-17 DIAGNOSIS — Z114 Encounter for screening for human immunodeficiency virus [HIV]: Secondary | ICD-10-CM

## 2024-03-17 DIAGNOSIS — Z125 Encounter for screening for malignant neoplasm of prostate: Secondary | ICD-10-CM

## 2024-03-17 DIAGNOSIS — Z1159 Encounter for screening for other viral diseases: Secondary | ICD-10-CM

## 2024-03-17 NOTE — Progress Notes (Signed)
 Date:  03/17/2024   Name:  Roberto Holder   DOB:  02-26-1961   MRN:  [id number]   Chief Complaint: Annual Exam  HPI Roberto Holder presents today for routine physical exam.  No particular complaints.  Has a dermatologist, some concern for rosacea versus food allergy.  He has prediabetes with last A1c 5.9%, also would like to lose weight, so in the summer I had recommended low-dose metformin  for him but he did not start it, citing that he would like to check A1c one more time before beginning.  Last Physical: 2-3y ago Last Dental Exam: 31m ago.  Has appointment in about 3 weeks with new dentist. Last Eye Exam: 2y ago Last CRC screen: 08/24/21, several polyps, adenomatous, repeat 3y Last PSA: 02/17/23 normal Immunizations Due: Tdap, Prevnar 20, Shingrix, flu  Referred to ENT but has not scheduled yet.   Medication list has been reviewed and updated.  Current Meds  Medication Sig   aspirin  EC 81 MG tablet Take 1 tablet (81 mg total) by mouth daily. Swallow whole.   B Complex Vitamins (VITAMIN B-COMPLEX PO) Take by mouth.   MAGNESIUM PO Take by mouth 2 (two) times a week.   montelukast  (SINGULAIR ) 10 MG tablet Take 1 tablet (10 mg total) by mouth at bedtime.   tadalafil  (CIALIS ) 10 MG tablet Take 1 tablet (10 mg total) by mouth daily as needed for erectile dysfunction.     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   Other Rash    TAPE   Tape Rash    Paper tape ok    Immunization History   Administered Date(s) Administered   Influenza Inj Mdck Quad Pf 05/06/2019   PFIZER(Purple Top)SARS-COV-2 Vaccination 09/11/2019, 10/05/2019    Past Surgical History:  Procedure Laterality Date   CHOLECYSTECTOMY     COLONOSCOPY WITH PROPOFOL  N/A 08/24/2021   Procedure: COLONOSCOPY WITH PROPOFOL ;  Surgeon: Roberto Carmine, MD;  Location: [facility];  Service: Endoscopy;  Laterality: N/A;   GANGLION CYST EXCISION Right    wrist   KNEE ARTHROTOMY Left 01/27/2018   Procedure: KNEE ARTHROTOMY MEDIAL MENISOUS ROOT REPAIR;  Surgeon: Roberto Priest, MD;  Location: [facility];  Service: Orthopedics;  Laterality: Left;   POLYPECTOMY  08/24/2021   Procedure: POLYPECTOMY;  Surgeon: Roberto Carmine, MD;  Location: [facility];  Service: Endoscopy;;    Social History   Tobacco Use   Smoking status: Never   Smokeless tobacco: Never  Vaping Use   Vaping status: Never Used  Substance Use Topics   Alcohol use: Yes    Alcohol/week: 2.0 standard drinks of alcohol    Types: 2 Cans of beer per week    Comment: socially   Drug use: No    Family History  Problem Relation Age of Onset   Cancer Mother        colon   Hypertension Mother    Heart attack Mother    Heart disease Mother  Stroke Mother    Cancer Father        colon   Heart attack Father 25   Heart disease Father    Heart disease Sister    Heart attack Brother    Heart disease Brother    Heart attack Brother    Heart disease Brother         03/17/2024    8:34 AM 01/23/2024    2:43 PM 09/15/2023    8:39 AM 03/17/2023   10:27 AM  GAD 7 : Generalized Anxiety Score  Nervous, Anxious, on Edge 0 0 0 0  Control/stop worrying 0 0 0 0  Worry too much - different things 0 0 0 0  Trouble relaxing 0 0 0 0  Restless 0 0 0 0  Easily annoyed or irritable 0 0 0 0  Afraid - awful might happen 0 0 0 0  Total GAD 7 Score 0 0 0 0  Anxiety Difficulty Not difficult at all Not difficult at all Not difficult at all Not  difficult at all       03/17/2024    8:34 AM 01/23/2024    2:43 PM 09/15/2023    8:39 AM  Depression screen PHQ 2/9  Decreased Interest 0 0 0  Down, Depressed, Hopeless 0 0 0  PHQ - 2 Score 0 0 0  Altered sleeping 0    Tired, decreased energy 1    Change in appetite 1    Feeling bad or failure about yourself  0    Trouble concentrating 0    Moving slowly or fidgety/restless 0    Suicidal thoughts 0    PHQ-9 Score 2    Difficult doing work/chores Not difficult at all      BP Readings from Last 3 Encounters:  03/17/24 122/74  02/13/24 128/82  01/26/24 (!) 145/90    Wt Readings from Last 3 Encounters:  03/17/24 210 lb 2 oz (95.3 kg)  02/13/24 210 lb 8 oz (95.5 kg)  01/23/24 208 lb (94.3 kg)    BP 122/74   Pulse 74   Ht 5' 8.5 (1.74 m)   Wt 210 lb 2 oz (95.3 kg)   SpO2 95%   BMI 31.48 kg/m   Physical Exam Vitals and nursing note reviewed.  Constitutional:      Appearance: Normal appearance.  HENT:     Left Ear: A middle ear effusion (mild) is present.     Nose: Congestion present.     Mouth/Throat:     Mouth: Mucous membranes are moist. No oral lesions.     Dentition: Normal dentition.     Pharynx: No posterior oropharyngeal erythema.  Eyes:     Extraocular Movements: Extraocular movements intact.     Conjunctiva/sclera: Conjunctivae normal.     Pupils: Pupils are equal, round, and reactive to light.  Neck:     Thyroid: No thyromegaly.  Cardiovascular:     Rate and Rhythm: Normal rate and regular rhythm.     Heart sounds: No murmur heard.    No friction rub. No gallop.     Comments: Pulses 2+ at radial, PT, DP bilaterally. No carotid bruit. No peripheral edema Pulmonary:     Effort: Pulmonary effort is normal.     Breath sounds: Normal breath sounds.  Abdominal:     General: Bowel sounds are normal.     Palpations: Abdomen is soft. There is no mass.     Tenderness: There is no abdominal tenderness.  Genitourinary:  Comments: Genital/rectal exam  declined. Musculoskeletal:     Comments: Full ROM with strength 5/5 bilateral upper and lower extremities  Lymphadenopathy:     Cervical: No cervical adenopathy.  Skin:    General: Skin is warm.     Capillary Refill: Capillary refill takes less than 2 seconds.     Findings: No lesion or rash.  Neurological:     Mental Status: He is alert and oriented to person, place, and time.     Gait: Gait is intact.  Psychiatric:        Mood and Affect: Mood normal.        Behavior: Behavior normal.     Recent Labs     Component Value Date/Time   NA 134 (L) 02/17/2023 0822   NA 142 06/07/2022 0000   K 4.2 02/17/2023 0822   CL 103 02/17/2023 0822   CO2 23 02/17/2023 0822   GLUCOSE 115 (H) 02/17/2023 0822   BUN 16 02/17/2023 0822   BUN 13 06/07/2022 0000   CREATININE 0.94 02/17/2023 0822   CALCIUM  8.9 02/17/2023 0822   PROT 8.1 02/17/2023 0822   ALBUMIN 4.1 02/17/2023 0822   AST 21 02/17/2023 0822   ALT 21 02/17/2023 0822   ALKPHOS 49 02/17/2023 0822   BILITOT 1.4 (H) 02/17/2023 0822   GFRNONAA >60 02/17/2023 0822   GFRAA >60 11/15/2019 0810    Lab Results  Component Value Date   WBC 7.3 10/17/2022   HGB 15.7 10/17/2022   HCT 46.7 10/17/2022   MCV 85.4 10/17/2022   PLT 230 10/17/2022   Lab Results  Component Value Date   HGBA1C 5.9 (H) 02/17/2023   Lab Results  Component Value Date   CHOL 178 02/17/2023   HDL 45 02/17/2023   LDLCALC 102 (H) 02/17/2023   TRIG 154 (H) 02/17/2023   CHOLHDL 4.0 02/17/2023   Lab Results  Component Value Date   TSH 1.69 06/07/2022      Assessment and Plan:  1. Annual physical exam (Primary) Encouraged healthy lifestyle including regular physical activity and consumption of whole fruits and vegetables. Encouraged routine dental and eye exams.   Check nonfasting labs today  - PSA - Hemoglobin A1c - Lipid panel - HIV Antibody (routine testing w rflx) - Hepatitis C antibody - CBC with Differential/Platelet - Comprehensive  metabolic panel with GFR - TSH  2. Prediabetes - Hemoglobin A1c  3. Mild hyperlipidemia Check nonfasting lipids - Lipid panel  4. Immunization due Reviewed immunizations with patient. He declines today but plans to at least get Shingrix at his local [facility] on Friday.   5. Screening PSA (prostate specific antigen) - PSA  6. Screening for HIV (human immunodeficiency virus) - HIV Antibody (routine testing w rflx)  7. Need for hepatitis C screening test - Hepatitis C antibody     Return in about 6 months (around 09/14/2024) for OV f/u preDM.    Rolan Hoyle, PA-C, DMSc, Nutritionist [facility] [phone]

## 2024-03-18 ENCOUNTER — Ambulatory Visit: Payer: Self-pay | Admitting: Physician Assistant

## 2024-03-18 LAB — CBC WITH DIFFERENTIAL/PLATELET
Basophils Absolute: 0 x10E3/uL (ref 0.0–0.2)
Basos: 1 %
EOS (ABSOLUTE): 0.1 x10E3/uL (ref 0.0–0.4)
Eos: 2 %
Hematocrit: 46.8 % (ref 37.5–51.0)
Hemoglobin: 15.3 g/dL (ref 13.0–17.7)
Immature Grans (Abs): 0 x10E3/uL (ref 0.0–0.1)
Immature Granulocytes: 0 %
Lymphocytes Absolute: 1.5 x10E3/uL (ref 0.7–3.1)
Lymphs: 26 %
MCH: 29 pg (ref 26.6–33.0)
MCHC: 32.7 g/dL (ref 31.5–35.7)
MCV: 89 fL (ref 79–97)
Monocytes Absolute: 0.5 x10E3/uL (ref 0.1–0.9)
Monocytes: 8 %
Neutrophils Absolute: 3.7 x10E3/uL (ref 1.4–7.0)
Neutrophils: 62 %
Platelets: 220 x10E3/uL (ref 150–450)
RBC: 5.28 x10E6/uL (ref 4.14–5.80)
RDW: 12.7 % (ref 11.6–15.4)
WBC: 5.8 x10E3/uL (ref 3.4–10.8)

## 2024-03-18 LAB — HEMOGLOBIN A1C
Est. average glucose Bld gHb Est-mCnc: 126 mg/dL
Hgb A1c MFr Bld: 6 % — ABNORMAL HIGH (ref 4.8–5.6)

## 2024-03-18 LAB — COMPREHENSIVE METABOLIC PANEL WITH GFR
ALT: 22 IU/L (ref 0–44)
AST: 17 IU/L (ref 0–40)
Albumin: 4.3 g/dL (ref 3.9–4.9)
Alkaline Phosphatase: 70 IU/L (ref 47–123)
BUN/Creatinine Ratio: 12 (ref 10–24)
BUN: 12 mg/dL (ref 8–27)
Bilirubin Total: 1.1 mg/dL (ref 0.0–1.2)
CO2: 22 mmol/L (ref 20–29)
Calcium: 9.3 mg/dL (ref 8.6–10.2)
Chloride: 105 mmol/L (ref 96–106)
Creatinine, Ser: 0.99 mg/dL (ref 0.76–1.27)
Globulin, Total: 2.7 g/dL (ref 1.5–4.5)
Glucose: 130 mg/dL — ABNORMAL HIGH (ref 70–99)
Potassium: 4.1 mmol/L (ref 3.5–5.2)
Sodium: 142 mmol/L (ref 134–144)
Total Protein: 7 g/dL (ref 6.0–8.5)
eGFR: 85 mL/min/1.73 (ref >59)

## 2024-03-18 LAB — TSH: TSH: 1.77 u[IU]/mL (ref 0.450–4.500)

## 2024-03-18 LAB — PSA: Prostate Specific Ag, Serum: 2.1 ng/mL (ref 0.0–4.0)

## 2024-03-18 LAB — HEPATITIS C ANTIBODY: Hep C Virus Ab: NONREACTIVE

## 2024-03-18 LAB — HIV ANTIBODY (ROUTINE TESTING W REFLEX): HIV Screen 4th Generation wRfx: NONREACTIVE

## 2024-03-18 LAB — LIPID PANEL
Chol/HDL Ratio: 4.4 ratio (ref 0.0–5.0)
Cholesterol, Total: 169 mg/dL (ref 100–199)
HDL: 38 mg/dL — ABNORMAL LOW (ref >39)
LDL Chol Calc (NIH): 93 mg/dL (ref 0–99)
Triglycerides: 222 mg/dL — ABNORMAL HIGH (ref 0–149)
VLDL Cholesterol Cal: 38 mg/dL (ref 5–40)

## 2024-03-28 ENCOUNTER — Ambulatory Visit
Admission: EM | Admit: 2024-03-28 | Discharge: 2024-03-28 | Disposition: A | Discharge status: Home / Self Care | Attending: Physician Assistant | Admitting: Physician Assistant

## 2024-03-28 ENCOUNTER — Encounter: Payer: Self-pay | Admitting: Emergency Medicine

## 2024-03-28 DIAGNOSIS — R0981 Nasal congestion: Secondary | ICD-10-CM

## 2024-03-28 DIAGNOSIS — J019 Acute sinusitis, unspecified: Secondary | ICD-10-CM

## 2024-03-28 MED ORDER — IPRATROPIUM BROMIDE 0.06 % NA SOLN: 2.0000 | Freq: Four times a day (QID) | NASAL | 0 refills | Status: AC

## 2024-03-28 MED ORDER — AMOXICILLIN-POT CLAVULANATE 875-125 MG PO TABS: 1.0000 | ORAL_TABLET | Freq: Two times a day (BID) | ORAL | 0 refills | Status: AC

## 2024-03-28 MED ORDER — FEXOFENADINE-PSEUDOEPHED ER 60-120 MG PO TB12: 1.0000 | ORAL_TABLET | Freq: Two times a day (BID) | ORAL | 0 refills | Status: AC

## 2024-03-28 NOTE — ED Triage Notes (Signed)
 Patient c/o sinus pain and pressure and nasal congestion that has gotten worse for the past 5 days.  Patient reports green nasal drainage.  Patient has been taking OTC Muccinex. Patient denies fevers.

## 2024-03-28 NOTE — Discharge Instructions (Signed)
-   Sent times are more consistent with allergies or a virus at this time.  However, I printed a prescription for an antibiotic if you are not feeling better in 5 more days. - You should start taking the Allegra-D I sent to the pharmacy.  If insurance does not cover it you can purchase it behind the counter. - I also sent you a nasal spray.  Once the swelling in your nose goes down you should be able to use saline spray. - If you start the antibiotic take the entire course.

## 2024-03-28 NOTE — ED Provider Notes (Signed)
 [facility]    CSN: [id number] Arrival date & time: 03/28/24  0955      History   Chief Complaint Chief Complaint  Patient presents with   Sinus Problem    HPI Roberto Holder is a 63 y.o. male presenting for 5-day history of nasal congestion and sinus pressure.  Patient reports greenish nasal drainage that started today.  Denies fever.  Has had a mild cough but it has not been productive.  Denies chest pain or shortness of breath.  Patient reports history of sinus infections.  He was treated with Levaquin  last month for sinus infection.  He also took doxycycline  last month for folliculitis.  He has been taking Mucinex  and using nasal saline over-the-counter without any relief.  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   Chronic pain of left knee 01/15/2024   Hyperbilirubinemia 09/15/2023   Seasonal allergic rhinitis due to pollen 09/15/2023   Prediabetes 03/17/2023   Erectile dysfunction 02/18/2023   History of hypertension 02/14/2023   Mild hyperlipidemia 02/14/2023   History of adenomatous polyp of colon     Past Surgical History:  Procedure Laterality Date   CHOLECYSTECTOMY     COLONOSCOPY WITH PROPOFOL  N/A 08/24/2021   Procedure: COLONOSCOPY WITH PROPOFOL ;  Surgeon: Jinny Carmine, MD;  Location: [facility];  Service: Endoscopy;  Laterality: N/A;   GANGLION CYST EXCISION Right    wrist   KNEE ARTHROTOMY Left 01/27/2018   Procedure: KNEE ARTHROTOMY MEDIAL MENISOUS ROOT REPAIR;  Surgeon: Tobie Priest, MD;  Location: [facility];  Service: Orthopedics;  Laterality: Left;   POLYPECTOMY  08/24/2021   Procedure:  POLYPECTOMY;  Surgeon: Jinny Carmine, MD;  Location: [facility];  Service: Endoscopy;;       Home Medications    Prior to Admission medications   Medication Sig Start Date End Date Taking? Authorizing Provider  amoxicillin -clavulanate (AUGMENTIN ) 875-125 MG tablet Take 1 tablet by mouth every 12 (twelve) hours for 7 days. 03/28/24 04/04/24 Yes Arvis Jolan NOVAK, PA-C  fexofenadine -pseudoephedrine  (ALLEGRA-D) 60-120 MG 12 hr tablet Take 1 tablet by mouth 2 (two) times daily. 03/28/24  Yes Arvis Jolan B, PA-C  ipratropium (ATROVENT ) 0.06 % nasal spray Place 2 sprays into both nostrils 4 (four) times daily. 03/28/24  Yes Arvis Jolan NOVAK, PA-C  aspirin  EC 81 MG tablet Take 1 tablet (81 mg total) by mouth daily. Swallow whole. 10/17/22   Gerard Frederick, NP  B Complex Vitamins (VITAMIN B-COMPLEX PO) Take by mouth.    [provider]  MAGNESIUM PO Take by mouth 2 (two) times a week.    [provider]  montelukast  (SINGULAIR ) 10 MG tablet Take 1 tablet (10 mg total) by mouth at bedtime. 01/23/24   Manya Toribio SQUIBB, PA  tadalafil  (CIALIS ) 10 MG tablet Take 1 tablet (10 mg total) by mouth daily as needed for erectile dysfunction. 02/18/23   Manya Toribio SQUIBB, PA    Family History Family History  Problem Relation Age of Onset   Cancer Mother        colon   Hypertension Mother    Heart attack Mother  Heart disease Mother    Stroke Mother    Cancer Father        colon   Heart attack Father 66   Heart disease Father    Heart disease Sister    Heart attack Brother    Heart disease Brother    Heart attack Brother    Heart disease Brother     Social History Social History   Tobacco Use   Smoking status: Never   Smokeless tobacco: Never  Vaping Use   Vaping status: Never Used  Substance Use Topics   Alcohol use: Yes    Alcohol/week: 2.0 standard drinks of alcohol    Types: 2 Cans of beer per week    Comment: socially   Drug use: No     Allergies    Alendronate, Other, and Tape   Review of Systems Review of Systems  Constitutional:  Positive for fatigue. Negative for fever.  HENT:  Positive for congestion, rhinorrhea, sinus pressure and sinus pain. Negative for sore throat.   Respiratory:  Positive for cough. Negative for shortness of breath.   Cardiovascular:  Negative for chest pain.  Gastrointestinal:  Negative for abdominal pain, diarrhea, nausea and vomiting.  Musculoskeletal:  Negative for myalgias.  Neurological:  Negative for weakness, light-headedness and headaches.  Hematological:  Negative for adenopathy.     Physical Exam Triage Vital Signs ED Triage Vitals  Encounter Vitals Group     BP 03/28/24 1011 (!) 143/84     Girls Systolic BP Percentile --      Girls Diastolic BP Percentile --      Boys Systolic BP Percentile --      Boys Diastolic BP Percentile --      Pulse Rate 03/28/24 1011 78     Resp 03/28/24 1011 15     Temp 03/28/24 1011 98 F (36.7 C)     Temp Source 03/28/24 1011 Oral     SpO2 03/28/24 1011 97 %     Weight 03/28/24 1009 204 lb (92.5 kg)     Height 03/28/24 1009 5' 8.5 (1.74 m)     Head Circumference --      Peak Flow --      Pain Score 03/28/24 1009 6     Pain Loc --      Pain Education --      Exclude from Growth Chart --    No data found.  Updated Vital Signs BP (!) 143/84 (BP Location: Left Arm)   Pulse 78   Temp 98 F (36.7 C) (Oral)   Resp 15   Ht 5' 8.5 (1.74 m)   Wt 204 lb (92.5 kg)   SpO2 97%   BMI 30.57 kg/m      Physical Exam Vitals and nursing note reviewed.  Constitutional:      General: He is not in acute distress.    Appearance: Normal appearance. He is well-developed. He is not ill-appearing.  HENT:     Head: Normocephalic and atraumatic.     Right Ear: Tympanic membrane, ear canal and external ear normal.     Left Ear: Tympanic membrane, ear canal and external ear normal.     Nose: Congestion present.     Mouth/Throat:     Mouth: Mucous membranes  are moist.     Pharynx: Oropharynx is clear.  Eyes:     Conjunctiva/sclera: Conjunctivae normal.  Cardiovascular:     Rate and Rhythm: Normal rate and regular rhythm.  Pulmonary:  Effort: Pulmonary effort is normal. No respiratory distress.     Breath sounds: Normal breath sounds.  Abdominal:     Palpations: Abdomen is soft.     Tenderness: There is no abdominal tenderness.  Musculoskeletal:     Cervical back: Neck supple.  Skin:    General: Skin is warm and dry.     Capillary Refill: Capillary refill takes less than 2 seconds.  Neurological:     General: No focal deficit present.     Mental Status: He is alert. Mental status is at baseline.     Motor: No weakness.     Gait: Gait normal.  Psychiatric:        Mood and Affect: Mood normal.        Behavior: Behavior normal.      UC Treatments / Results  Labs (all labs ordered are listed, but only abnormal results are displayed) Labs Reviewed - No data to display  EKG   Radiology No results found.  Procedures Procedures (including critical care time)  Medications Ordered in UC Medications - No data to display  Initial Impression / Assessment and Plan / UC Course  I have reviewed the triage vital signs and the nursing notes.  Pertinent labs & imaging results that were available during my care of the patient were reviewed by me and considered in my medical decision making (see chart for details).   63 year old male presents for 5-day history of sinus congestion and pressure.  History of recurrent sinusitis.  Treated last month with Levaquin  and doxycycline .  Doxycycline  was for folliculitis.  Patient reports no recent relief with OTC meds.   On evaluation he does have significant nasal congestion and postnasal drainage.  Chest is clear.  Heart regular rate and rhythm.  Acute sinusitis, likely viral or allergic.  Sent Atrovent  nasal spray and Allegra-D to pharmacy for symptoms encouraged to continue nasal saline  rinses.  I printed a prescription for Augmentin  for him to begin in 5 days if symptoms are not improving.  Reviewed I am hesitant to prescribe more antibiotics since he is frequently on antibiotics.  Patient is understanding and says he does not like taking antibiotics unless he needs to because it upsets his stomach.  Reviewed return precautions.   Final Clinical Impressions(s) / UC Diagnoses   Final diagnoses:  Acute sinusitis, recurrence not specified, unspecified location  Nasal congestion     Discharge Instructions      - Sent times are more consistent with allergies or a virus at this time.  However, I printed a prescription for an antibiotic if you are not feeling better in 5 more days. - You should start taking the Allegra-D I sent to the pharmacy.  If insurance does not cover it you can purchase it behind the counter. - I also sent you a nasal spray.  Once the swelling in your nose goes down you should be able to use saline spray. - If you start the antibiotic take the entire course.   ED Prescriptions     Medication Sig Dispense Auth. Provider   fexofenadine -pseudoephedrine  (ALLEGRA-D) 60-120 MG 12 hr tablet Take 1 tablet by mouth 2 (two) times daily. 30 tablet Arvis Huxley B, PA-C   ipratropium (ATROVENT ) 0.06 % nasal spray Place 2 sprays into both nostrils 4 (four) times daily. 15 mL Arvis Huxley B, PA-C   amoxicillin -clavulanate (AUGMENTIN ) 875-125 MG tablet Take 1 tablet by mouth every 12 (twelve) hours for 7 days. 14 tablet Arvis Huxley  B, PA-C      PDMP not reviewed this encounter.   Arvis Jolan NOVAK, PA-C 03/28/24 1114

## 2024-04-01 ENCOUNTER — Ambulatory Visit: Payer: 59 | Admitting: Dermatology

## 2024-07-07 ENCOUNTER — Telehealth: Payer: Self-pay

## 2024-07-07 NOTE — Telephone Encounter (Signed)
 The patient called requesting to speak with Rosaline, stating that he was returning her call to schedule his colonoscopy. The patient is requesting the [place] location.

## 2024-07-07 NOTE — Telephone Encounter (Signed)
 Pt call to schedule procedure.

## 2024-07-08 ENCOUNTER — Other Ambulatory Visit: Payer: Self-pay

## 2024-07-08 ENCOUNTER — Telehealth: Payer: Self-pay

## 2024-07-08 DIAGNOSIS — Z860101 Personal history of adenomatous and serrated colon polyps: Secondary | ICD-10-CM

## 2024-07-08 DIAGNOSIS — Z800 Family history of malignant neoplasm of digestive organs: Secondary | ICD-10-CM

## 2024-07-08 MED ORDER — NA SULFATE-K SULFATE-MG SULF 17.5-3.13-1.6 GM/177ML PO SOLN: 1.0000 | Freq: Once | ORAL | 0 refills | Status: AC

## 2024-07-08 NOTE — Telephone Encounter (Signed)
 Gastroenterology Pre-Procedure Review  Request Date: 08/27/24 Requesting Physician: Dr. Melany  PATIENT REVIEW QUESTIONS: The patient responded to the following health history questions as indicated:    1. Are you having any GI issues? no 2. Do you have a personal history of Polyps? yes (last colonoscopy 08/24/21 Dr. Jinny recommended repeat in 3 years) 3. Do you have a family history of Colon Cancer or Polyps? yes (mother and father colon cancer) 4. Diabetes Mellitus? no 5. Joint replacements in the past 12 months?no 6. Major health problems in the past 3 months?no 7. Any artificial heart valves, MVP, or defibrillator?no    MEDICATIONS & ALLERGIES:    Patient reports the following regarding taking any anticoagulation/antiplatelet therapy:   Plavix, Coumadin, Eliquis, Xarelto, Lovenox, Pradaxa, Brilinta, or Effient? no Aspirin ? Yes 81 mg daily  Patient confirms/reports the following medications:  Current Outpatient Medications  Medication Sig Dispense Refill   aspirin  EC 81 MG tablet Take 1 tablet (81 mg total) by mouth daily. Swallow whole. 30 tablet 1   B Complex Vitamins (VITAMIN B-COMPLEX PO) Take by mouth.     fexofenadine -pseudoephedrine  (ALLEGRA-D) 60-120 MG 12 hr tablet Take 1 tablet by mouth 2 (two) times daily. 30 tablet 0   ipratropium (ATROVENT ) 0.06 % nasal spray Place 2 sprays into both nostrils 4 (four) times daily. 15 mL 0   MAGNESIUM PO Take by mouth 2 (two) times a week.     montelukast  (SINGULAIR ) 10 MG tablet Take 1 tablet (10 mg total) by mouth at bedtime. 30 tablet 3   tadalafil  (CIALIS ) 10 MG tablet Take 1 tablet (10 mg total) by mouth daily as needed for erectile dysfunction. 20 tablet 2   No current facility-administered medications for this visit.    Patient confirms/reports the following allergies:  Allergies[1]  No orders of the defined types were placed in this encounter.   AUTHORIZATION INFORMATION Primary Insurance: 1D#: Group #:  Secondary  Insurance: 1D#: Group #:  SCHEDULE INFORMATION: Date: 08/27/24 Time: Location: [facility]    [1]  Allergies Allergen Reactions   Alendronate     Other reaction(s): Other (See Comments) Raw throat, bumps inside mouth.   Other Rash    TAPE   Tape Rash    Paper tape ok

## 2024-07-08 NOTE — Progress Notes (Signed)
 Instructions printed and sent in mychart.  Thanks,  Rosaline CMA

## 2024-07-12 ENCOUNTER — Ambulatory Visit: Admitting: Dermatology

## 2024-07-19 ENCOUNTER — Ambulatory Visit

## 2024-07-19 ENCOUNTER — Ambulatory Visit
Admission: EM | Admit: 2024-07-19 | Discharge: 2024-07-19 | Disposition: A | Discharge status: Home / Self Care | Attending: Emergency Medicine | Admitting: Emergency Medicine

## 2024-07-19 ENCOUNTER — Encounter: Payer: Self-pay | Admitting: Emergency Medicine

## 2024-07-19 DIAGNOSIS — M545 Low back pain, unspecified: Secondary | ICD-10-CM | POA: Diagnosis not present

## 2024-07-19 DIAGNOSIS — S8002XA Contusion of left knee, initial encounter: Secondary | ICD-10-CM | POA: Diagnosis not present

## 2024-07-19 DIAGNOSIS — M25562 Pain in left knee: Secondary | ICD-10-CM | POA: Diagnosis not present

## 2024-07-19 MED ORDER — IBUPROFEN 600 MG PO TABS: 600.0000 mg | ORAL_TABLET | Freq: Four times a day (QID) | ORAL | 0 refills | Status: AC | PRN

## 2024-07-19 MED ORDER — BACLOFEN 10 MG PO TABS: 10.0000 mg | ORAL_TABLET | Freq: Three times a day (TID) | ORAL | 0 refills | Status: AC

## 2024-07-19 MED ORDER — IBUPROFEN 800 MG PO TABS
800.0000 mg | ORAL_TABLET | Freq: Once | ORAL | Status: AC
  Administered 2024-07-19: 800 mg via ORAL

## 2024-07-19 NOTE — ED Provider Notes (Signed)
 " [facility]    CSN: [id number] Arrival date & time: 07/19/24  0810      History   Chief Complaint Chief Complaint  Patient presents with   Fall    HPI Roberto Holder is a 64 y.o. male.   HPI  64 year old male with past medical history significant for hypertension, hyperlipidemia, and ED presents for evaluation of low back pain and left knee pain.  He reports that he slipped and fell on his deck last night.  He landed on his butt with his back twisted and his left leg up underneath him.  He has taken Tylenol  for the pain.  No numbness or tingling.  No saddle anesthesia or loss of bowel or bladder control.  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   Chronic pain of left knee 01/15/2024   Hyperbilirubinemia 09/15/2023   Seasonal allergic rhinitis due to pollen 09/15/2023   Prediabetes 03/17/2023   Erectile dysfunction 02/18/2023   History of hypertension 02/14/2023   Mild hyperlipidemia 02/14/2023   History of adenomatous polyp of colon     Past Surgical History:  Procedure Laterality Date   CHOLECYSTECTOMY     COLONOSCOPY WITH PROPOFOL  N/A 08/24/2021   Procedure: COLONOSCOPY WITH PROPOFOL ;  Surgeon: Jinny Carmine, MD;  Location: [facility];  Service: Endoscopy;  Laterality: N/A;   GANGLION CYST EXCISION Right    wrist   KNEE ARTHROTOMY Left 01/27/2018   Procedure: KNEE ARTHROTOMY MEDIAL MENISOUS ROOT REPAIR;  Surgeon: Tobie Priest, MD;  Location: [facility];  Service: Orthopedics;  Laterality: Left;   POLYPECTOMY  08/24/2021   Procedure: POLYPECTOMY;  Surgeon: Jinny Carmine, MD;  Location: [facility];   Service: Endoscopy;;       Home Medications    Prior to Admission medications  Medication Sig Start Date End Date Taking? Authorizing Provider  baclofen  (LIORESAL ) 10 MG tablet Take 1 tablet (10 mg total) by mouth 3 (three) times daily. 07/19/24  Yes Bernardino Ditch, NP  ibuprofen  (ADVIL ) 600 MG tablet Take 1 tablet (600 mg total) by mouth every 6 (six) hours as needed. 07/19/24  Yes Bernardino Ditch, NP  aspirin  EC 81 MG tablet Take 1 tablet (81 mg total) by mouth daily. Swallow whole. 10/17/22   Gerard Frederick, NP  B Complex Vitamins (VITAMIN B-COMPLEX PO) Take by mouth.    [provider]  fexofenadine -pseudoephedrine  (ALLEGRA-D) 60-120 MG 12 hr tablet Take 1 tablet by mouth 2 (two) times daily. 03/28/24   Arvis Huxley B, PA-C  ipratropium (ATROVENT ) 0.06 % nasal spray Place 2 sprays into both nostrils 4 (four) times daily. 03/28/24   Arvis Huxley NOVAK, PA-C  MAGNESIUM PO Take by mouth 2 (two) times a week.    [provider]  montelukast  (SINGULAIR ) 10 MG tablet Take 1 tablet (10 mg total) by mouth at bedtime. 01/23/24   Manya Toribio SQUIBB, PA  tadalafil  (CIALIS ) 10 MG tablet Take 1 tablet (10 mg total) by mouth daily as needed for erectile dysfunction. 02/18/23   Manya Toribio SQUIBB, PA    Family History Family History  Problem Relation Age of Onset   Cancer  Mother        colon   Hypertension Mother    Heart attack Mother    Heart disease Mother    Stroke Mother    Cancer Father        colon   Heart attack Father 79   Heart disease Father    Heart disease Sister    Heart attack Brother    Heart disease Brother    Heart attack Brother    Heart disease Brother     Social History Social History[1]   Allergies   Alendronate, Other, and Tape   Review of Systems Review of Systems  Musculoskeletal:  Positive for arthralgias and back pain. Negative for joint swelling.  Skin:  Positive for color change.  Neurological:  Negative for weakness and numbness.      Physical Exam Triage Vital Signs ED Triage Vitals  Encounter Vitals Group     BP      Girls Systolic BP Percentile      Girls Diastolic BP Percentile      Boys Systolic BP Percentile      Boys Diastolic BP Percentile      Pulse      Resp      Temp      Temp src      SpO2      Weight      Height      Head Circumference      Peak Flow      Pain Score      Pain Loc      Pain Education      Exclude from Growth Chart    No data found.  Updated Vital Signs BP 139/81 (BP Location: Right Arm)   Pulse 85   Temp 97.8 F (36.6 C) (Oral)   Resp 16   Wt 215 lb (97.5 kg)   SpO2 95%   BMI 32.22 kg/m   Visual Acuity Right Eye Distance:   Left Eye Distance:   Bilateral Distance:    Right Eye Near:   Left Eye Near:    Bilateral Near:     Physical Exam Vitals and nursing note reviewed.  Constitutional:      Appearance: Normal appearance. He is not ill-appearing.  HENT:     Head: Normocephalic and atraumatic.  Musculoskeletal:        General: Tenderness and signs of injury present. No swelling or deformity. Normal range of motion.  Skin:    General: Skin is warm and dry.     Capillary Refill: Capillary refill takes less than 2 seconds.     Findings: Bruising present.  Neurological:     General: No focal deficit present.     Mental Status: He is alert and oriented to person, place, and time.      UC Treatments / Results  Labs (all labs ordered are listed, but only abnormal results are displayed) Labs Reviewed - No data to display  EKG   Radiology DG Knee Complete 4 Views Left Result Date: 07/19/2024 CLINICAL DATA:  Pain after a fall. EXAM: LEFT KNEE - COMPLETE 4+ VIEW COMPARISON:  01/16/2024. FINDINGS: No acute osseous or joint abnormality. Trace patellofemoral osteophytosis. IMPRESSION: No acute findings. Electronically Signed   By: Newell Eke M.D.   On: 07/19/2024 09:57   DG Lumbar Spine Complete Result Date: 07/19/2024 CLINICAL DATA:  Spinous  process tenderness.  Fall. EXAM: LUMBAR SPINE - COMPLETE 4+ VIEW COMPARISON:  None Available. FINDINGS: Minimal grade 1  anterolisthesis of L4 on L5, likely degenerative in etiology. Alignment is otherwise anatomic. Vertebral body heights are maintained. Multilevel endplate degenerative changes, worst at L5-S1, where is also loss of disc space height. Facet hypertrophy in the lower lumbar spine. No definite pars defects. IMPRESSION: 1. No acute findings. 2. 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 Electronically Signed   By: Newell Eke M.D.   On: 07/19/2024 09:57    Procedures Procedures (including critical care time)  Medications Ordered in UC Medications  ibuprofen  (ADVIL ) tablet 800 mg (800 mg Oral Given 07/19/24 9147)    Initial Impression / Assessment and Plan / UC Course  I have reviewed the triage vital signs and the nursing notes.  Pertinent labs & imaging results that were available during my care of the patient were reviewed by me and considered in my medical decision making (see chart for details).   Patient is a pleasant, nontoxic-appearing 64 year old male presenting for evaluation of musculoskeletal complaints after suffering a ground-level fall last night.  He was able to get himself up and he is ambulatory but is complaining of pain in the left knee, where he previously had an MCL repair, and in his low back.  He has taken Tylenol  for his pain but has not taken anything this morning.  I will order 800 mg of ibuprofen  for the patient.  On exam, the patient has midline spinous process tenderness in his lumbar spine but no step-off.  He also has tenderness and spasm in the paraspinous regions bilaterally.  This is increased with range of motion.  Due to the spinous process tenderness I will obtain a radiograph of the lumbar spine to rule out any bony injury. In regards to the patient's knee pain he does have pain primarily on the medial aspect and there is some  ecchymosis forming over the anterior medial aspect of the knee.  The patient is tender with palpation over the medial and the as well as mild tenderness in the quadriceps complex.  No pain when palpating the patella, lateral joint line, popliteal space, or tibial tuberosity.  Passive extension and flexion do cause pain in the medial aspect as well as pain with varus and valgus stress testing on the medial aspect of the knee.  No appreciable laxity.  I will obtain a radiograph of the knee to evaluate for any bony injury.  Right knee x-rays independently reviewed and evaluated by me.  Impression: Degenerative changes noted throughout the patella, and 3 compartments of the knee.  There is a radiopaque density in the popliteal fossa with smooth edges.  I do not suspect that this is new.  This radiolucent fragment also appears in the posterior aspect of the lateral compartment of the knee.  Radiology overread is pending. Radiology impression states no acute findings.  Lumbar spine x-rays independently reviewed and evaluated by me.  Impression: Degenerative spurring at the superior endplates of L1-L5.  No evidence of vertebral fracture.  There is a loss of lumbar lordosis suggesting spasm.  Mild to space narrowing at T12-L1, L1-L2, L2-L3, L3-L4, L4-L5, L5-S1.  Radiology overread is pending. Radiology impression states no acute findings.  Multilevel degenerative disc disease worst at L5-S1.  I will discharge patient home with a diagnosis of low back pain and contusion of left knee.  I will have staff fit him with a hinged knee brace for support.  Ibuprofen  and baclofen  to help with muscle pain and spasm.  Moist heat application to improve blood flow and  aid in muscle healing.  Home physical therapy exercises prescribed at discharge.  I will also the patient follow-up with his orthopedic surgeon should his knee pain continue.   Final Clinical Impressions(s) / UC Diagnoses   Final diagnoses:  Acute bilateral  low back pain without sciatica  Acute pain of left knee  Contusion of left knee, initial encounter     Discharge Instructions      Your x-rays show no acute findings in your knee or low back.  You do have multilevel degenerative disc disease in your back.  Wear the hinged knee brace to support your knee and protect it from further injury.  Take the ibuprofen , 600 mg every 6 hours with food, on a schedule for the next 48 hours and then as needed.  Take the baclofen , 10 mg every 8 hours, on a schedule for the next 48 hours and then as needed.  Apply moist heat to your back for 30 minutes at a time 2-3 times a day to improve blood flow to the area and help remove the lactic acid causing the spasm.  Follow the back exercises given at discharge.  Return for reevaluation for any new or worsening symptoms.  If your knee pain is not improving I would recommend making a follow-up appointment with your orthopedic surgeon who performed your [facility] repair for further evaluation and possible complex imaging such as MRI.     ED Prescriptions     Medication Sig Dispense Auth. Provider   ibuprofen  (ADVIL ) 600 MG tablet Take 1 tablet (600 mg total) by mouth every 6 (six) hours as needed. 30 tablet Bernardino Ditch, NP   baclofen  (LIORESAL ) 10 MG tablet Take 1 tablet (10 mg total) by mouth 3 (three) times daily. 30 each Bernardino Ditch, NP      PDMP not reviewed this encounter.     [1]  Social History Tobacco Use   Smoking status: Never   Smokeless tobacco: Never  Vaping Use   Vaping status: Never Used  Substance Use Topics   Alcohol use: Yes    Alcohol/week: 2.0 standard drinks of alcohol    Types: 2 Cans of beer per week    Comment: socially   Drug use: No     Bernardino Ditch, NP 07/19/24 1009  "

## 2024-07-19 NOTE — Discharge Instructions (Addendum)
 Your x-rays show no acute findings in your knee or low back.  You do have multilevel degenerative disc disease in your back.  Wear the hinged knee brace to support your knee and protect it from further injury.  Take the ibuprofen , 600 mg every 6 hours with food, on a schedule for the next 48 hours and then as needed.  Take the baclofen , 10 mg every 8 hours, on a schedule for the next 48 hours and then as needed.  Apply moist heat to your back for 30 minutes at a time 2-3 times a day to improve blood flow to the area and help remove the lactic acid causing the spasm.  Follow the back exercises given at discharge.  Return for reevaluation for any new or worsening symptoms.  If your knee pain is not improving I would recommend making a follow-up appointment with your orthopedic surgeon who performed your [facility] repair for further evaluation and possible complex imaging such as MRI.

## 2024-07-19 NOTE — ED Triage Notes (Addendum)
 Pt fell on his deck last night. Pt c/o lower back and left knee pain. Pt is concerned because he had surgery on that knee. Pt has taken tylenol  for the pain.

## 2024-08-27 ENCOUNTER — Ambulatory Visit: Admit: 2024-08-27 | Admitting: Gastroenterology

## 2024-08-27 SURGERY — COLONOSCOPY
Anesthesia: Choice

## 2024-09-14 ENCOUNTER — Ambulatory Visit: Admitting: Physician Assistant

## 2025-02-15 ENCOUNTER — Ambulatory Visit: Admitting: Dermatology

## 2025-03-21 ENCOUNTER — Encounter: Admitting: Physician Assistant
